# Patient Record
Sex: Female | Born: 1990 | Race: White | Hispanic: No | Marital: Married | Smoking: Never smoker
Health system: Southern US, Community
[De-identification: ages and names within clinical notes are randomized; demographics above are authoritative.]

## PROBLEM LIST (undated history)

## (undated) DIAGNOSIS — F419 Anxiety disorder, unspecified: Secondary | ICD-10-CM

## (undated) DIAGNOSIS — F329 Major depressive disorder, single episode, unspecified: Secondary | ICD-10-CM

## (undated) DIAGNOSIS — F32A Depression, unspecified: Secondary | ICD-10-CM

## (undated) DIAGNOSIS — G43909 Migraine, unspecified, not intractable, without status migrainosus: Secondary | ICD-10-CM

## (undated) DIAGNOSIS — B2790 Infectious mononucleosis, unspecified without complication: Secondary | ICD-10-CM

## (undated) DIAGNOSIS — L4050 Arthropathic psoriasis, unspecified: Secondary | ICD-10-CM

## (undated) HISTORY — DX: Depression, unspecified: F32.A

## (undated) HISTORY — DX: Major depressive disorder, single episode, unspecified: F32.9

## (undated) HISTORY — DX: Anxiety disorder, unspecified: F41.9

## (undated) HISTORY — DX: Arthropathic psoriasis, unspecified: L40.50

## (undated) HISTORY — DX: Infectious mononucleosis, unspecified without complication: B27.90

## (undated) HISTORY — PX: OTHER SURGICAL HISTORY: SHX169

## (undated) HISTORY — DX: Migraine, unspecified, not intractable, without status migrainosus: G43.909

---
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
## 2012-01-22 ENCOUNTER — Ambulatory Visit (INDEPENDENT_AMBULATORY_CARE_PROVIDER_SITE_OTHER): Payer: Managed Care, Other (non HMO) | Admitting: Family Medicine

## 2012-01-22 VITALS — BP 103/70 | HR 79 | Temp 97.7°F | Resp 16 | Ht 64.5 in | Wt 142.0 lb

## 2012-01-22 DIAGNOSIS — H9209 Otalgia, unspecified ear: Secondary | ICD-10-CM

## 2012-01-22 DIAGNOSIS — IMO0002 Reserved for concepts with insufficient information to code with codable children: Secondary | ICD-10-CM

## 2012-01-22 NOTE — Progress Notes (Signed)
  Patient Name: Ashley Calderon Date of Birth: [birth year] Medical Record Number: [id number] Gender: female Date of Encounter: 01/22/2012  History of Present Illness:  Ashley Calderon is a 20 y.o. very pleasant female patient who presents with the following:  Has noted a ?cyst behind her right ear for about a month- on the back side of the lobe.  It will sometimes be tender, but does not drain.  Sometims is red- however this may be due to her attempts to squeeze and "pop" it.  She sometiems notes an ache in the ear, but it is not persistent.  No hearing changes.  Otherwise generally healthy.   LMP 01/09/12 Here today with her mother- it seems she had a similar cyst as a child but it resolved on its own  Patient Active Problem List  Diagnoses  . WEIGHT LOSS  . ABDOMINAL PAIN, UNSPECIFIED SITE  . Panic disorder   Past Medical History  Diagnosis Date  . Mononucleosis April 2010   Past Surgical History  Procedure Date  . Ganglion cyst removed     left wrist    History  Substance Use Topics  . Smoking status: Never Smoker   . Smokeless tobacco: Not on file  . Alcohol Use: No   No family history on file. Allergies  Allergen Reactions  . Penicillins     REACTION: GI upset    Medication list has been reviewed and updated.  Review of Systems: As per HPI- otherwise negative.   Physical Examination: Filed Vitals:   01/22/12 0843  BP: 103/70  Pulse: 79  Temp: 97.7 F (36.5 C)  TempSrc: Oral  Resp: 16  Height: 5' 4.5" (1.638 m)  Weight: 142 lb (64.411 kg)    Body mass index is 24.00 kg/(m^2).  GEN: WDWN, NAD, Non-toxic, A & O x 3 HEENT: Atraumatic, Normocephalic. Neck supple. No masses, No LAD.  TM, internal ear canals wnl, oropharynx wnl.   On the back side of her right ear lobe there is a ?cystic structure under the skin.  It is not terribly well circumscribed or moveable, but is somewhat soft.  No redness, heat or swelling to suggest infection, it is mildly  tender Ears and Nose: No external deformity. CV: RRR, No M/G/R. No JVD. No thrill. No extra heart sounds. PULM: CTA B, no wheezes, crackles, rhonchi. No retractions. No resp. distress. No accessory muscle use EXTR: No c/c/e NEURO Normal gait.  PSYCH: Normally interactive. Conversant. Not depressed or anxious appearing.  Calm demeanor.   VC obtained.  Cleaned area with betadine and then alcohol.  Anesthesia with a small wheal of 2% lidocaine.  Pierced ?cyst with an 18 gauge needle, but it did not drain.    Assessment and Plan: 1. Cyst    Cyst on earlobe.  Given delicate location will not attempt open removal of cyst in the office and needle I and D failed.  Will refer to plastic surgery for their opinion and possible treatment.

## 2012-02-03 ENCOUNTER — Ambulatory Visit (INDEPENDENT_AMBULATORY_CARE_PROVIDER_SITE_OTHER): Payer: Managed Care, Other (non HMO) | Admitting: Family Medicine

## 2012-02-03 ENCOUNTER — Encounter: Payer: Self-pay | Admitting: Family Medicine

## 2012-02-03 VITALS — BP 100/68 | Temp 97.5°F | Wt 144.0 lb

## 2012-02-03 DIAGNOSIS — H6000 Abscess of external ear, unspecified ear: Secondary | ICD-10-CM

## 2012-02-03 DIAGNOSIS — L0202 Furuncle of face: Secondary | ICD-10-CM

## 2012-02-03 DIAGNOSIS — F410 Panic disorder [episodic paroxysmal anxiety] without agoraphobia: Secondary | ICD-10-CM

## 2012-02-03 MED ORDER — SERTRALINE HCL 50 MG PO TABS: 50.0000 mg | ORAL_TABLET | Freq: Every day | ORAL | Status: DC

## 2012-02-03 NOTE — Progress Notes (Signed)
  Subjective:    Patient ID: Ashley Calderon, female    DOB: Dec 15, 1990, 21 y.o.   MRN: [id number]  HPI  Patient seen for the following issues  History of panic attacks. Requesting refill sertraline. Medications work very well suppressing her panic attacks. She recently ran out for a brief period and had recurrent symptoms. She has not had any history of significant depression. No side effects.  Slightly painful cystic lesion behind right ear. Noted about a month ago. Went to a local urgent care. Attempt at I&D apparently with syringe with minimal for any drainage. She has only mild pain at this time. No fever or chills. No fluctuance.  Review of Systems  Constitutional: Negative for fever and chills.  Cardiovascular: Negative for chest pain.  Hematological: Negative for adenopathy.  Psychiatric/Behavioral: Negative for dysphoric mood. The patient is nervous/anxious.        Objective:   Physical Exam  Constitutional: She appears well-developed and well-nourished.  HENT:  Right Ear: External ear normal.  Left Ear: External ear normal.       Patient has small cystic lesion behind right ear. Nonfluctuant. Minimally tender. No warmth. Minimal erythema.  Neck: Neck supple. No thyromegaly present.  Cardiovascular: Normal rate and regular rhythm.   Pulmonary/Chest: Effort normal and breath sounds normal. No respiratory distress. She has no wheezes. She has no rales.  Lymphadenopathy:    She has no cervical adenopathy.          Assessment & Plan:  #1 recurrent panic attacks. Stable. Refill sertraline for one year #2 small furuncle posterior right ear. Warm compresses. Incision and drainage if any increased redness, swelling, or pain

## 2012-02-03 NOTE — Patient Instructions (Signed)
Warm compresses to cyst 2-3 times daily Follow up for any increased redness, swelling, or pain

## 2012-03-15 ENCOUNTER — Ambulatory Visit (INDEPENDENT_AMBULATORY_CARE_PROVIDER_SITE_OTHER): Payer: Managed Care, Other (non HMO) | Admitting: Internal Medicine

## 2012-03-15 ENCOUNTER — Encounter: Payer: Self-pay | Admitting: Internal Medicine

## 2012-03-15 VITALS — BP 120/80 | Temp 98.2°F | Wt 142.0 lb

## 2012-03-15 DIAGNOSIS — B999 Unspecified infectious disease: Secondary | ICD-10-CM

## 2012-03-15 DIAGNOSIS — L089 Local infection of the skin and subcutaneous tissue, unspecified: Secondary | ICD-10-CM

## 2012-03-15 MED ORDER — DOXYCYCLINE HYCLATE 100 MG PO TABS: 100.0000 mg | ORAL_TABLET | Freq: Two times a day (BID) | ORAL | Status: AC

## 2012-03-15 NOTE — Patient Instructions (Signed)
You  may move around, but avoid painful motions and activities.  Apply  Heat  to the sore area for 15 to 20 minutes 3 or 4 times daily for the next two to 3 days.  OK to use Advil or Tylenol  Take your antibiotic as prescribed until ALL of it is gone, but stop if you develop a rash, swelling, or any side effects of the medication.  Contact our office as soon as possible if  there are side effects of the medication.  Call or return to clinic prn if these symptoms worsen or fail to improve as anticipated.

## 2012-03-15 NOTE — Progress Notes (Signed)
  Subjective:    Patient ID: Ashley Calderon, female    DOB: 1990-12-15, 21 y.o.   MRN: [id number]  HPI  21 year old patient who presents with a 3 to four-day history of worsening left knee pain. There has been no trauma a couple days ago she had some low-grade fever that has resolved she has noted a area of pain and inflammation involving her right knee only.    Review of Systems  Musculoskeletal: Positive for joint swelling.  Skin: Positive for wound.       Objective:   Physical Exam  Skin:       A tender erythematous inflammatory nodule 2 cm in diameter just inferior to the patella          Assessment & Plan:   Soft tissue infection. Penicillin allergy. Will apply warm compresses and treated with doxycycline. She will call there is not prompt clinical improvement

## 2012-04-04 ENCOUNTER — Encounter: Payer: Self-pay | Admitting: Internal Medicine

## 2012-04-04 ENCOUNTER — Telehealth: Payer: Self-pay | Admitting: Family Medicine

## 2012-04-04 ENCOUNTER — Ambulatory Visit (INDEPENDENT_AMBULATORY_CARE_PROVIDER_SITE_OTHER): Payer: Managed Care, Other (non HMO) | Admitting: Internal Medicine

## 2012-04-04 VITALS — BP 112/74 | Temp 98.2°F | Wt 148.0 lb

## 2012-04-04 DIAGNOSIS — T50995A Adverse effect of other drugs, medicaments and biological substances, initial encounter: Secondary | ICD-10-CM

## 2012-04-04 DIAGNOSIS — T364X5A Adverse effect of tetracyclines, initial encounter: Secondary | ICD-10-CM

## 2012-04-04 LAB — BASIC METABOLIC PANEL
BUN: 9 mg/dL (ref 6–23)
CO2: 28 mEq/L (ref 19–32)
Calcium: 9.6 mg/dL (ref 8.4–10.5)
Chloride: 105 mEq/L (ref 96–112)
Glucose, Bld: 92 mg/dL (ref 70–99)
Potassium: 3.7 mEq/L (ref 3.5–5.1)
Sodium: 140 mEq/L (ref 135–145)

## 2012-04-04 LAB — CBC WITH DIFFERENTIAL/PLATELET
Basophils Absolute: 0 10*3/uL (ref 0.0–0.1)
Basophils Relative: 0.2 % (ref 0.0–3.0)
Eosinophils Absolute: 0.2 10*3/uL (ref 0.0–0.7)
Eosinophils Relative: 1.8 % (ref 0.0–5.0)
HCT: 38 % (ref 36.0–46.0)
Hemoglobin: 12.6 g/dL (ref 12.0–15.0)
Lymphocytes Relative: 29.8 % (ref 12.0–46.0)
Lymphs Abs: 2.5 10*3/uL (ref 0.7–4.0)
MCHC: 33.3 g/dL (ref 30.0–36.0)
MCV: 83.6 fl (ref 78.0–100.0)
Monocytes Absolute: 0.7 10*3/uL (ref 0.1–1.0)
Monocytes Relative: 8.2 % (ref 3.0–12.0)
Neutro Abs: 5 10*3/uL (ref 1.4–7.7)
RBC: 4.54 Mil/uL (ref 3.87–5.11)
RDW: 14.1 % (ref 11.5–14.6)
WBC: 8.3 10*3/uL (ref 4.5–10.5)

## 2012-04-04 LAB — HEPATIC FUNCTION PANEL
ALT: 19 U/L (ref 0–35)
Albumin: 4.4 g/dL (ref 3.5–5.2)
Alkaline Phosphatase: 73 U/L (ref 39–117)
Bilirubin, Direct: 0 mg/dL (ref 0.0–0.3)
Total Protein: 7.5 g/dL (ref 6.0–8.3)

## 2012-04-04 MED ORDER — PREDNISONE 10 MG PO TABS: ORAL_TABLET | ORAL | Status: DC

## 2012-04-04 NOTE — Progress Notes (Signed)
  Subjective:    Patient ID: Ashley Calderon, female    DOB: Jul 24, 1991, 21 y.o.   MRN: [id number]  HPI  21 year old white female complains of nail abnormality. Her symptoms started after she finished course of doxycycline for soft tissue infection in her knee. Patient reports all of her fingernails are affected. She has red discoloration underneath her nailbed and tips of her fingers are tender. Nail her great toes are also affected. She denies any joint pains or systemic rash. She has never taken doxycycline in the past.  Review of Systems No fever or joint pain  Past Medical History  Diagnosis Date  . Mononucleosis April 2010    History   Social History  . Marital Status: Single    Spouse Name: N/A    Number of Children: N/A  . Years of Education: N/A   Occupational History  . Not on file.   Social History Main Topics  . Smoking status: Never Smoker   . Smokeless tobacco: Not on file  . Alcohol Use: No  . Drug Use: No  . Sexually Active: No   Other Topics Concern  . Not on file   Social History Narrative   StudentLives with mom, dad, sister and brotherAttends Early College at [place].     Past Surgical History  Procedure Date  . Ganglion cyst removed     left wrist     No family history on file.  Allergies  Allergen Reactions  . Doxycycline Other (See Comments)    Possible vasculitis  . Penicillins     REACTION: GI upset    Current Outpatient Prescriptions on File Prior to Visit  Medication Sig Dispense Refill  . sertraline (ZOLOFT) 50 MG tablet Take 1 tablet (50 mg total) by mouth daily.  90 tablet  3    BP 112/74  Temp 98.2 F (36.8 C) (Oral)  Wt 148 lb (67.132 kg)       Objective:   Physical Exam  Constitutional: She appears well-developed and well-nourished.  HENT:  Head: Normocephalic and atraumatic.  Eyes: Conjunctivae are normal. Pupils are equal, round, and reactive to light.  Cardiovascular: Normal rate, regular rhythm and  normal heart sounds.   Pulmonary/Chest: Breath sounds normal. She has no wheezes.  Skin: No rash noted.       Patient has tender fingernails of hand bilaterally question subungual hemorrhage          Assessment & Plan:

## 2012-04-04 NOTE — Telephone Encounter (Signed)
Caller: Britini/Patient; PCP: Evelena Peat; CB#: [phone]; LMP 03/17/12.  Call regarding Finger Tenderness; Relates she has been on atbx for knee infection and has tenderness in fingernails and toenails and there are marks on them.  Onset "2 weeks ago" and getting worse; nails are changing colors.  Appt. with Dr. Artist Pais at 1400; caller states she is unable to make earlier appt. Hand Non-Injury protocol used.

## 2012-04-04 NOTE — Patient Instructions (Addendum)
Please call our office if your symptoms do not improve or gets worse.  

## 2012-04-04 NOTE — Assessment & Plan Note (Signed)
Patient complains of tender fingernails with possible subungual hemorrhage after taking doxycycline. Question vasculitis reaction. She has discontinued doxycycline. Treat with short course of prednisone. Check CBC, BMET, hepatic function.  Followup with primary care physician within one to 2 weeks.  Patient advised to call office if symptoms persist or worsen.

## 2012-04-05 ENCOUNTER — Telehealth: Payer: Self-pay | Admitting: Family Medicine

## 2012-04-05 NOTE — Telephone Encounter (Signed)
Ashley Calderon, FYI only.

## 2012-04-05 NOTE — Telephone Encounter (Signed)
Caller: Kao/Patient; PCP: Evelena Peat; CB#: [phone]; LMP 03/18/12. Calling today 04/05/12 regarding she saw Dr. Artist Pais yesterday for allergic reaction to doxycycline (nail beds were discolored) and MD prescribed Prednisone, said on first day she is to take 3 pills, wants to know if she takes all 3 pills at the same time.  Advised pt yes, take all 3 pills at once (reviewed medication order in EPIC with pt). Also now having joint pain that started last night. Wants to know if this could be related to the allergic reaction.  OFFICE, PLEASE CALL PT BACK AT [phone] TO ADVISE.

## 2012-04-05 NOTE — Telephone Encounter (Signed)
Ashley Calderon calling for results of the labs that she had yesterday, 7/31 when she was seen in the office. Results reviewed in EPIC, all wnl. Caller advised of same and has no further questions.

## 2012-04-05 NOTE — Telephone Encounter (Signed)
Patient informed that 'joint pain' can be another side effect of reaction to Doxycycline--per Dr. Artist Pais, continue prednisone and F/U with PCP in about [1] wk; pt has OV scheduled for 08.14.13, will reschedule for earlier if no better with prednisone and/or if symptoms worsen/SLS

## 2012-04-12 ENCOUNTER — Ambulatory Visit (INDEPENDENT_AMBULATORY_CARE_PROVIDER_SITE_OTHER): Payer: Managed Care, Other (non HMO) | Admitting: Family Medicine

## 2012-04-12 ENCOUNTER — Telehealth: Payer: Self-pay | Admitting: Family Medicine

## 2012-04-12 ENCOUNTER — Encounter: Payer: Self-pay | Admitting: Family Medicine

## 2012-04-12 VITALS — BP 100/60 | Temp 98.8°F | Wt 146.0 lb

## 2012-04-12 DIAGNOSIS — R202 Paresthesia of skin: Secondary | ICD-10-CM

## 2012-04-12 DIAGNOSIS — R209 Unspecified disturbances of skin sensation: Secondary | ICD-10-CM

## 2012-04-12 DIAGNOSIS — M2550 Pain in unspecified joint: Secondary | ICD-10-CM

## 2012-04-12 NOTE — Progress Notes (Signed)
Subjective:    Patient ID: Ashley Calderon, female    DOB: Feb 13, 1991, 21 y.o.   MRN: [id number]  HPI  Patient seen with polyarthralgias and intermittent paresthesias involving extremities. Her history is that she was seen June 11 and treated for cellulitis with doxycycline. Cellulitis did clear but she subsequently had some brownish discoloration underneath some of her nails and she was concerned this may be related to doxycycline. She describes around July 18 noticing fairly symmetric polyarthralgias involving neck, wrists, ankles, knees, elbows, and shoulders. No visible evidence for inflammation such as swelling or erythema and no warmth. Patient was seen here July 31 and lab work including CBC, hepatic panel, basic metabolic panel unremarkable. She was placed on prednisone which has not help much. Aleve helps somewhat. She continues to have polyarthralgias. She also describes intermittent fleeting episodes of paresthesias with tingling type sensation occasionally involving both hands and right lower extremity. No back pain. No rash. No fever. No lymphadenopathy. No appetite or weight changes. No recent sore throat. No extremity weakness.  Family history significant maternal grandmother rheumatoid arthritis. She's not had any recent tick bites. She did spend some time up in [place] earlier this summer.  Past Medical History  Diagnosis Date  . Mononucleosis April 2010   Past Surgical History  Procedure Date  . Ganglion cyst removed     left wrist     reports that she has never smoked. She does not have any smokeless tobacco history on file. She reports that she does not drink alcohol or use illicit drugs. family history is not on file. Allergies  Allergen Reactions  . Doxycycline Other (See Comments)    Possible vasculitis  . Penicillins     REACTION: GI upset      Review of Systems  Constitutional: Negative for fever and chills.  HENT: Negative for congestion and sore  throat.   Eyes: Negative for visual disturbance.  Respiratory: Negative for cough and shortness of breath.   Cardiovascular: Negative for chest pain.  Gastrointestinal: Negative for abdominal pain.  Genitourinary: Negative for dysuria and hematuria.  Musculoskeletal: Positive for arthralgias. Negative for myalgias, joint swelling and gait problem.  Skin: Negative for rash.  Neurological: Positive for numbness.  Hematological: Negative for adenopathy. Does not bruise/bleed easily.       Objective:   Physical Exam  Constitutional: She is oriented to person, place, and time. She appears well-developed and well-nourished.  HENT:  Mouth/Throat: Oropharynx is clear and moist.  Neck: Neck supple. No thyromegaly present.  Cardiovascular: Normal rate and regular rhythm.   No murmur heard. Pulmonary/Chest: Effort normal and breath sounds normal. No respiratory distress. She has no wheezes. She has no rales.  Musculoskeletal: She exhibits no edema.       Full range of motion of all joints. No evidence for any nodules. No evidence for joint inflammation such as warmth or erythema. No visible edema  Lymphadenopathy:    She has no cervical adenopathy.  Neurological: She is alert and oriented to person, place, and time. She has normal reflexes. No cranial nerve deficit.       Full-strength upper and lower extremities  Skin: No rash noted.          Assessment & Plan:  Patient presents with polyarthralgias for the past couple of weeks without evidence for objective joint inflammation. She also has some vague intermittent paresthesias involving multiple extremities. Recent labs as above unremarkable. Check sed rate, ANA, rheumatoid factor but suspected will  be normal. Followup promptly for any objective evidence for inflammatory changes

## 2012-04-12 NOTE — Telephone Encounter (Signed)
Caller: Laurie/Mother; PCP: Evelena Peat; CB#: [phone]; ; ; Call regarding numbness and tingling; Onset 03/19/12;  Mother reports that after the patient starting taking Doxycycline for cellulitis she developed joint pain and bruising to her nail beds. She was then given Prednisone to help with these symptoms and it has not helped. The patient has tingling and numbness in both hands and both feet. Afebrile. Emergent symptom of "Symptoms began after starting or changing dose of prescription, nonprescription, alternative medication, or illicit drug" positive per Numbness or Tingling guideline. Appointment scheduled with Dr. Caryl Never at 4:00pm 04/12/12.

## 2012-04-13 LAB — SEDIMENTATION RATE: Sed Rate: 13 mm/hr (ref 0–22)

## 2012-04-14 LAB — RHEUMATOID FACTOR: Rhuematoid fact SerPl-aCnc: <10 IU/mL (ref <=14)

## 2012-04-16 ENCOUNTER — Telehealth: Payer: Self-pay | Admitting: Family Medicine

## 2012-04-16 LAB — ANA: Anti Nuclear Antibody(ANA): NEGATIVE

## 2012-04-16 NOTE — Progress Notes (Signed)
Quick Note:  Pt informed ______ 

## 2012-04-16 NOTE — Telephone Encounter (Signed)
Pts mom called and said that she lft a message with triage nurse this morning re: pt running a fever 99.6 after taking 2 alleve. Pts mom said that she hasn't gotten a call back.

## 2012-04-16 NOTE — Progress Notes (Signed)
Quick Note:  Pt informed and the 99 temp came and went yesterday and today, resolved with Aleve. Pt was instructed to monitor temp and any other sx. Pt informed all labs came back in normal range, negative ______

## 2012-04-16 NOTE — Telephone Encounter (Signed)
Caller: Laurie/Mother; Patient Name: Ashley Calderon; PCP: Evelena Peat; Best Callback Phone Number: [phone].   Fever onset 04/14/12.  Temp low grade 99.2 to 99.6 oral. Joint pain and tingling persist since she was seen on 04/12/12.  Generalized joint pains.   Emergent symptoms ruled out.  Reviewed medical record.   See provider in 24 hours per nursing judgment and No Guideline protocol.  Caller declined appointment and states provider is waiting on further lab results.  She states she would, however send her  back if MD decides to do test for Lyme Disease.  Note to office for follow up per No Guideline or Reference Available prototcol and nursing judgment.

## 2012-04-16 NOTE — Telephone Encounter (Signed)
Pt informed so far all labs normal, ANA pending

## 2012-04-16 NOTE — Telephone Encounter (Signed)
Pt needs blood work results °

## 2012-04-16 NOTE — Telephone Encounter (Signed)
Dr Caryl Never informed verbally of call, he did not feel the fever would be related, however monitor for any other additional sx and follow-up as needed.

## 2012-04-18 ENCOUNTER — Ambulatory Visit: Payer: Managed Care, Other (non HMO) | Admitting: Family Medicine

## 2012-04-19 ENCOUNTER — Ambulatory Visit: Payer: Managed Care, Other (non HMO) | Admitting: Family Medicine

## 2012-04-19 ENCOUNTER — Telehealth: Payer: Self-pay | Admitting: Family Medicine

## 2012-04-19 DIAGNOSIS — M2550 Pain in unspecified joint: Secondary | ICD-10-CM

## 2012-04-19 NOTE — Telephone Encounter (Signed)
Pt has appt today with Dr. Burchette 

## 2012-04-19 NOTE — Telephone Encounter (Signed)
Pt called and is req to get lab order to test for lyme disease as previously discussed with  Dr Caryl Never. Pt still having symptoms. Wants test done today.

## 2012-04-20 ENCOUNTER — Other Ambulatory Visit (INDEPENDENT_AMBULATORY_CARE_PROVIDER_SITE_OTHER): Payer: Managed Care, Other (non HMO)

## 2012-04-20 ENCOUNTER — Telehealth: Payer: Self-pay | Admitting: *Deleted

## 2012-04-20 DIAGNOSIS — M2550 Pain in unspecified joint: Secondary | ICD-10-CM

## 2012-04-20 NOTE — Telephone Encounter (Signed)
Patient called yesterday and she cancelled the appt on yesterday due to cost and the MD previously told her that he would order the lyme disease test and that is all she need. Please assist.

## 2012-04-20 NOTE — Telephone Encounter (Signed)
Okay to order labs?

## 2012-04-20 NOTE — Telephone Encounter (Signed)
Yes  I ordered. Let them know to come in for lyme antibodies

## 2012-04-20 NOTE — Telephone Encounter (Signed)
Okay to order labs for lyme disease?

## 2012-04-20 NOTE — Telephone Encounter (Signed)
Left message on machine for patient to call back for a lab appointment.

## 2012-04-20 NOTE — Telephone Encounter (Signed)
I will order lyme antibody screen.

## 2012-04-20 NOTE — Addendum Note (Signed)
Addended by: Kristian Covey on: 04/20/2012 12:49 PM   Modules accepted: Orders

## 2012-04-23 ENCOUNTER — Telehealth: Payer: Self-pay | Admitting: Family Medicine

## 2012-04-23 DIAGNOSIS — R202 Paresthesia of skin: Secondary | ICD-10-CM

## 2012-04-23 DIAGNOSIS — M2550 Pain in unspecified joint: Secondary | ICD-10-CM

## 2012-04-23 LAB — B. BURGDORFI ANTIBODIES: B burgdorferi Ab IgG+IgM: 0.22 {ISR}

## 2012-04-23 NOTE — Telephone Encounter (Signed)
I am happy to refer to Dr Maurice March but I'm not sure he would be able to see in timely manner and she is describing multiple neurologic type findings and we should not wait long to get her in.  Lyme ab tests negative.  Let's offer her follow up here sooner if unable to get in to Dr Maurice March in next couple of days.

## 2012-04-23 NOTE — Telephone Encounter (Signed)
Patient's mom called stating that she would like a referral to see Dr. Lina [facility] for her daughter's persistent swelling and spot on her knee and bruising. Now shortness of breath and heaviness in chest. Patient had a low grade fever for 3 days and headaches everyday. Tested for Lyme Disease. Patient experienced vision changes and scrambled speech and trouble spelling and focusing all over the weekend. Please advise/assist.

## 2012-04-23 NOTE — Telephone Encounter (Signed)
Spoke with Mom and referral request sent.  Mom will call back if an office visit is needed.

## 2012-04-24 ENCOUNTER — Other Ambulatory Visit: Payer: Self-pay | Admitting: Family Medicine

## 2012-04-24 ENCOUNTER — Telehealth: Payer: Self-pay | Admitting: Family Medicine

## 2012-04-24 DIAGNOSIS — R202 Paresthesia of skin: Secondary | ICD-10-CM

## 2012-04-24 DIAGNOSIS — M2550 Pain in unspecified joint: Secondary | ICD-10-CM

## 2012-04-24 NOTE — Telephone Encounter (Signed)
Spoke with Mom.  Patient has an appointment tomorrow with Dr Caryl Never and referral request sent.

## 2012-04-24 NOTE — Telephone Encounter (Signed)
Caller: [name]/Patient; Patient Name: Ashley Calderon; PCP: Evelena Peat; Best Callback Phone Number: [phone].  Last menstrual cycle 04/20/12.  Patient calling today 04/24/12 regarding she was in to see MD 2 weeks ago was tested for R/A and Lime disease and tests came back negative.  Has been referred to Rheumatoligist but does not know when she can get in to see them.  Wants to be seen by Dr. Caryl Never today if possible.  Says symptoms have gotten worse since she saw MD.  She has had some diarrhea x2 today, persistent headache, persistent joint pain.  Also having shortness of breath that started yesterday.  Gets out of breath when she walks around.  Onset of other symptoms since the 03/05/12.  Afebrile.  Also fatigue.  Emergent symptoms ruled out by Breathing Problems guidelines with exception of new or worsening breathing problems that have not been evaluated.  Advised patient that Dr. Caryl Never does not have appointment available today, offered appointment with other provider.  Patient declined says only wants to see Dr. Caryl Never.  OFFICE PLEASE CALL PATIENT BACK AT [phone] TO LET HER KNOW IF SHE CAN BE WORKED IN TO SEE DR. Caryl Never (TRIAGE DISPOSITION WAS SEE IN ED IMMEDIATELY).  IF PATIENT NOT ABLE TO BE SEEN BY MD IN OFFICE TODAY, PLEASE ADVISE TO ED IF NEEDS TO BE SEEN.

## 2012-04-25 ENCOUNTER — Encounter: Payer: Self-pay | Admitting: Family Medicine

## 2012-04-25 ENCOUNTER — Ambulatory Visit (INDEPENDENT_AMBULATORY_CARE_PROVIDER_SITE_OTHER): Payer: Managed Care, Other (non HMO) | Admitting: Family Medicine

## 2012-04-25 ENCOUNTER — Ambulatory Visit (INDEPENDENT_AMBULATORY_CARE_PROVIDER_SITE_OTHER)
Admission: RE | Admit: 2012-04-25 | Discharge: 2012-04-25 | Disposition: A | Discharge status: Home / Self Care | Payer: Managed Care, Other (non HMO) | Source: Ambulatory Visit | Attending: Family Medicine | Admitting: Family Medicine

## 2012-04-25 VITALS — BP 100/62 | HR 87 | Temp 98.4°F | Wt 147.0 lb

## 2012-04-25 DIAGNOSIS — R0600 Dyspnea, unspecified: Secondary | ICD-10-CM

## 2012-04-25 DIAGNOSIS — R5381 Other malaise: Secondary | ICD-10-CM

## 2012-04-25 DIAGNOSIS — R5383 Other fatigue: Secondary | ICD-10-CM

## 2012-04-25 DIAGNOSIS — R0989 Other specified symptoms and signs involving the circulatory and respiratory systems: Secondary | ICD-10-CM

## 2012-04-25 DIAGNOSIS — R0609 Other forms of dyspnea: Secondary | ICD-10-CM

## 2012-04-25 DIAGNOSIS — M2550 Pain in unspecified joint: Secondary | ICD-10-CM

## 2012-04-25 NOTE — Progress Notes (Signed)
  Subjective:    Patient ID: Ashley Calderon, female    DOB: [birth year], 21 y.o.   MRN: [id number]  HPI  Patient seen with multitude of symptoms over the past several days. Refer to prior note for details. Was treated for possible cellulitis with doxycycline back in June and since that time has had multiple symptoms including polyarthralgias, paresthesias involving extremities, intermittent headache. Several phone calls last week. For about 3-4 days low-grade fever from the 10th through 13th of this month. Fever now resolved. Has intermittent sweats. No appetite or weight changes. His new symptom of some dyspnea with activity of the past several days. No dyspnea at rest. No cough. No orthopnea. No chest pain. No pleuritic pain.  Patient had several screening lab tests which have all been unremarkable. No signs of inflammatory arthritis. At the family's request, Lyme antibody sent which was negative. Patient is in process of being scheduled with rheumatologist.  Past Medical History  Diagnosis Date  . Mononucleosis April 2010   Past Surgical History  Procedure Date  . Ganglion cyst removed     left wrist     reports that she has never smoked. She does not have any smokeless tobacco history on file. She reports that she does not drink alcohol or use illicit drugs. family history is not on file. Allergies  Allergen Reactions  . Doxycycline Other (See Comments)    Possible vasculitis  . Penicillins     REACTION: GI upset      Review of Systems  Constitutional: Positive for fatigue. Negative for fever, chills, appetite change and unexpected weight change.  Respiratory: Positive for shortness of breath. Negative for cough and wheezing.   Cardiovascular: Negative for chest pain, palpitations and leg swelling.  Gastrointestinal: Negative for nausea, vomiting and abdominal pain.  Genitourinary: Negative for dysuria.  Musculoskeletal: Positive for arthralgias.  Skin: Negative for rash.   Neurological: Negative for dizziness and syncope.  Hematological: Negative for adenopathy. Does not bruise/bleed easily.       Objective:   Physical Exam  Constitutional: She appears well-developed and well-nourished.  HENT:  Right Ear: External ear normal.  Left Ear: External ear normal.  Mouth/Throat: Oropharynx is clear and moist.  Neck: Neck supple. No thyromegaly present.  Cardiovascular: Normal rate and regular rhythm.  Exam reveals no gallop and no friction rub.   No murmur heard. Pulmonary/Chest: Effort normal and breath sounds normal. No respiratory distress. She has no wheezes. She has no rales.  Musculoskeletal: She exhibits no edema.       No signs of joint inflammation  Lymphadenopathy:    She has no cervical adenopathy.          Assessment & Plan:  Patient presents with multiple symptoms including polyarthralgias, paresthesias extremities, intermittent sweats and now new symptom of dyspnea. Her exam is nonfocal and pulse oximetry 99%. Question reaction to recent viral illness. Check chest x-ray to rule out cardiomegaly. Patient has polyarthralgias but no objective evidence for inflammation and screening labs unremarkable. Rheumatology referral pending.

## 2012-09-08 IMAGING — CR DG CHEST 2V
2 series · 2 of 2 positions shown · non-contrast
Comparison: None.

CLINICAL DATA: Shortness of breath.

CHEST - 2 VIEW

[view not recorded (1 of 2)]
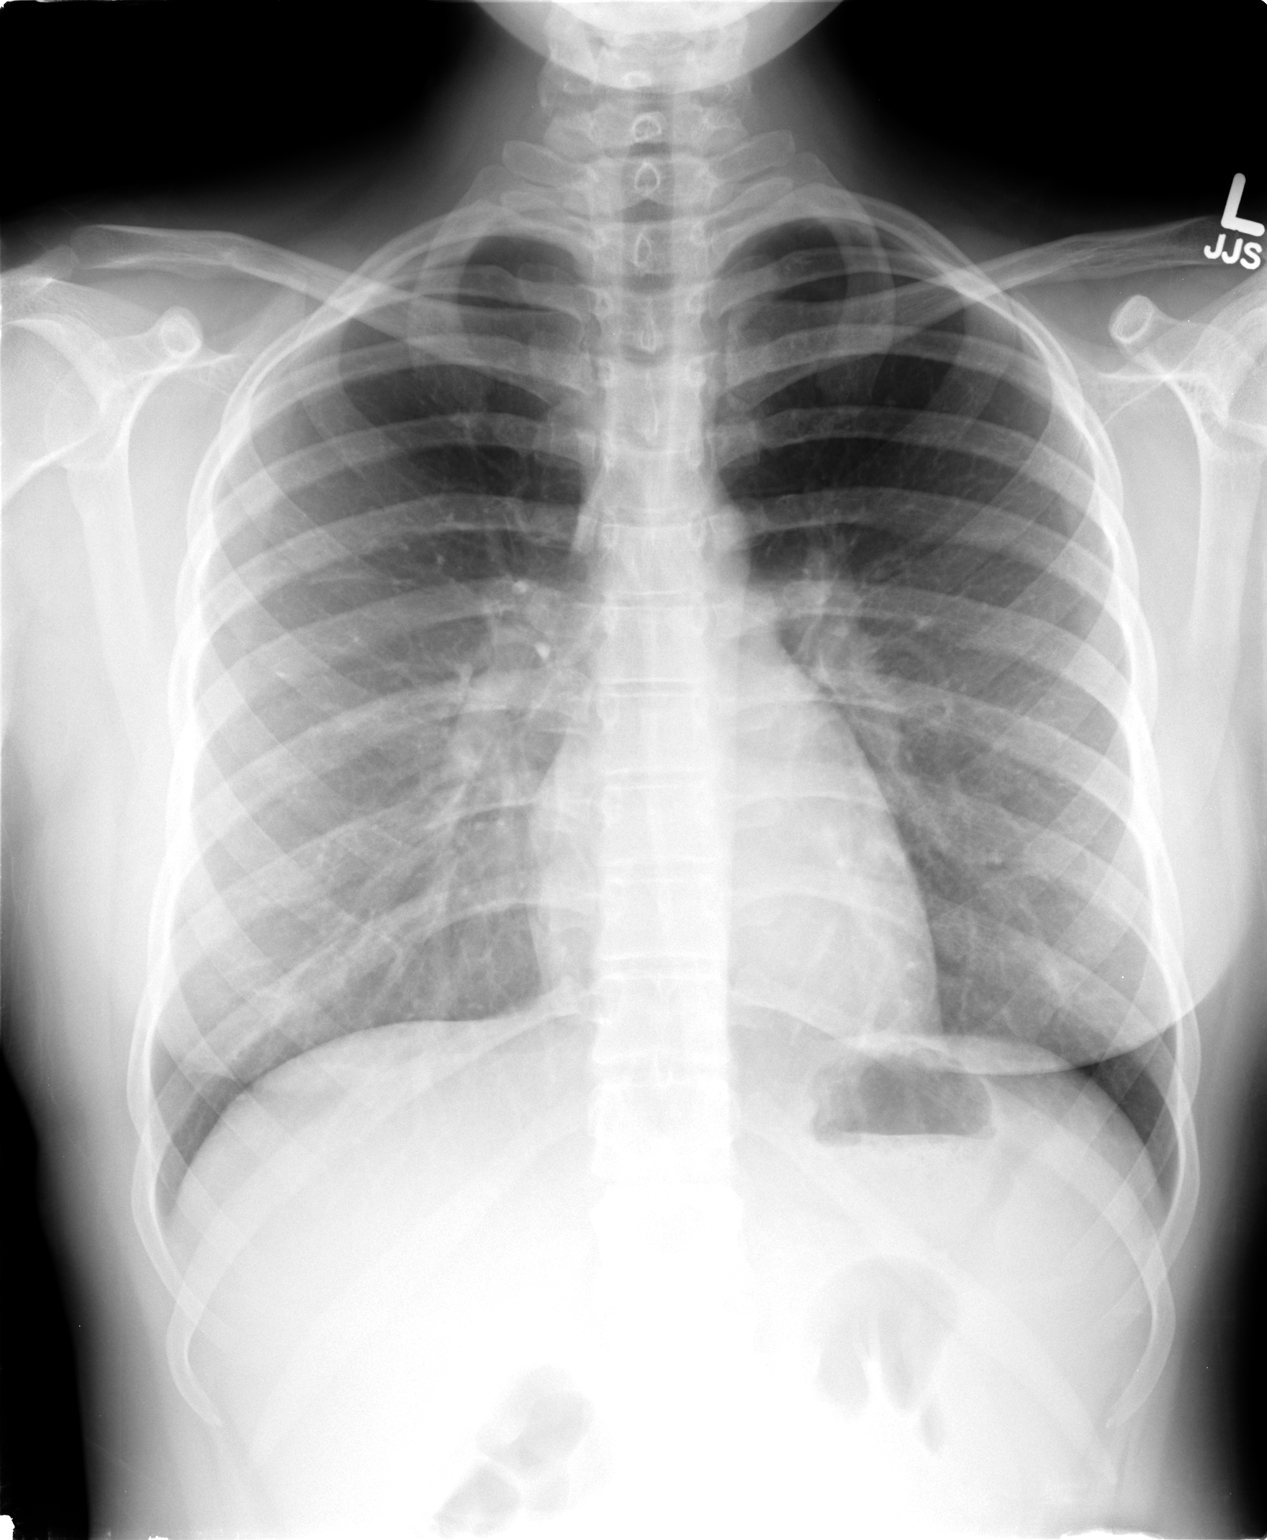

[view not recorded (2 of 2)]
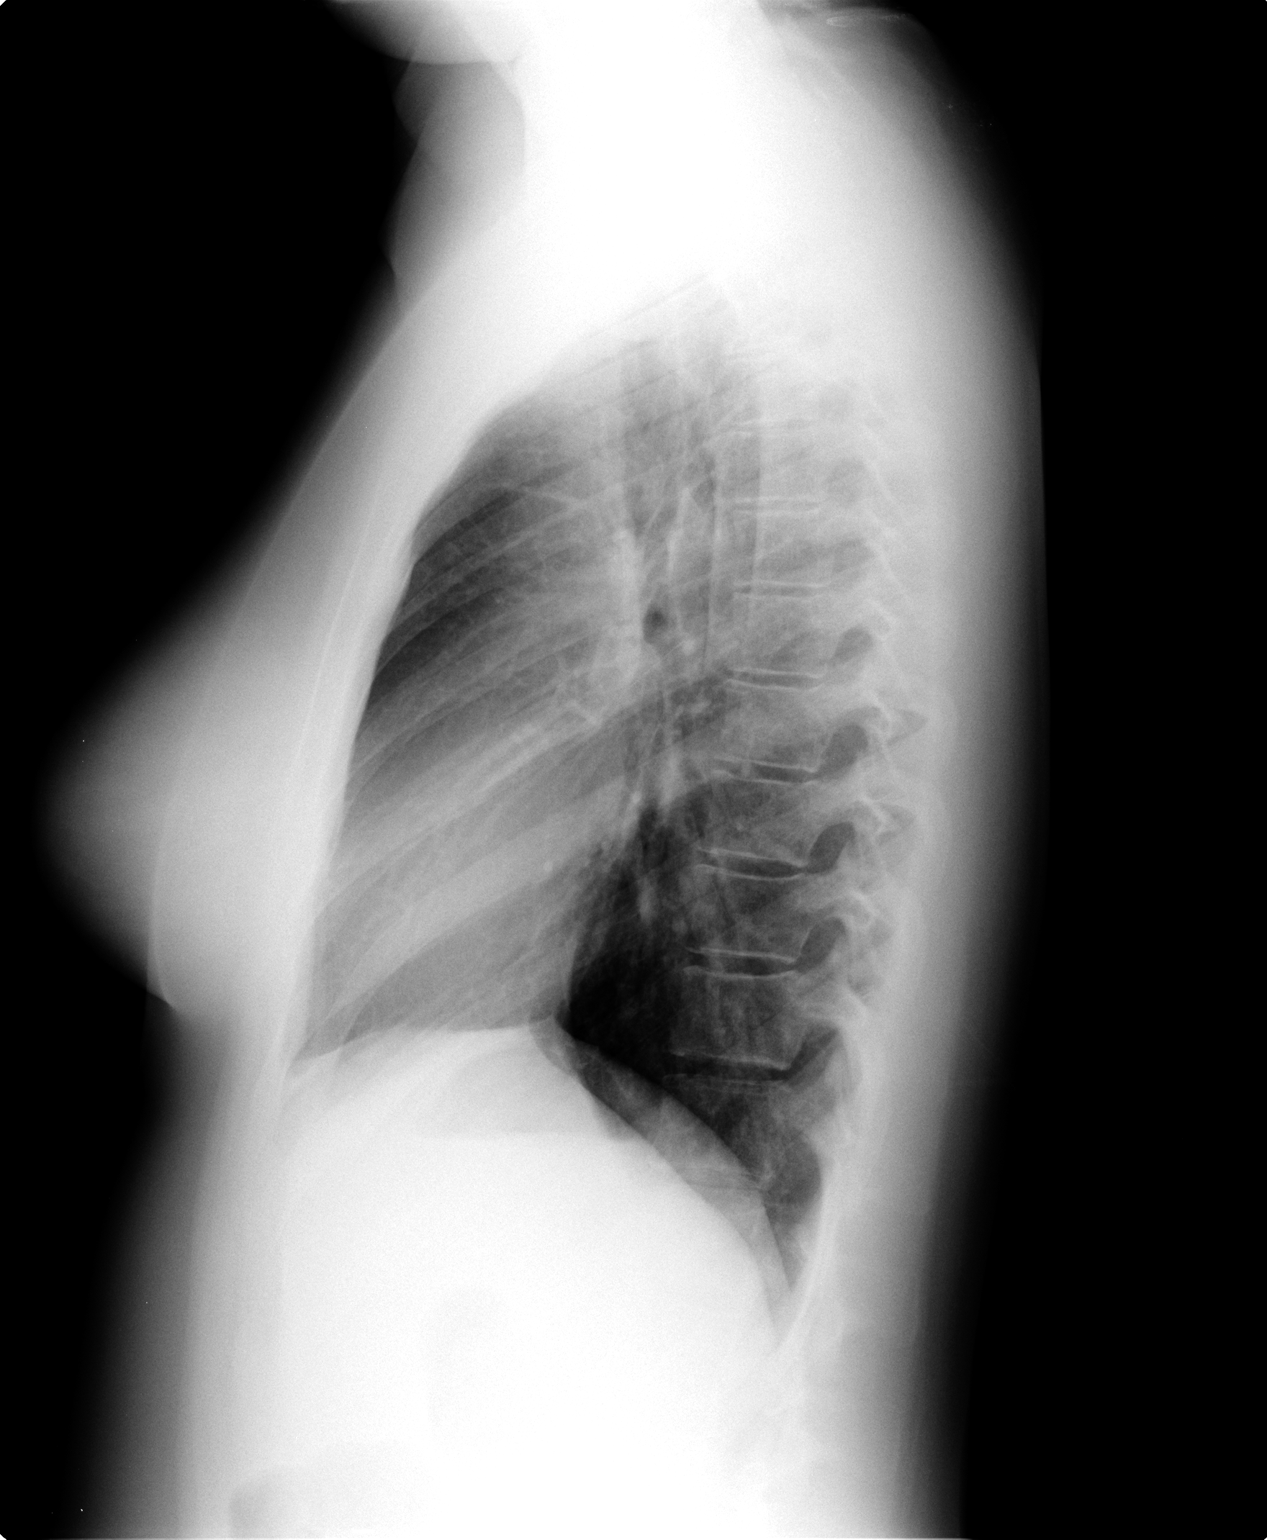

[2 of 2 positions shown; findings below may reference images not displayed]

FINDINGS: The cardiac silhouette is normal size and shape.  No
mediastinal or hilar abnormalities are evident.  No pulmonary
infiltrates or nodules were evident. No pleural abnormality is
evident. Bones appear average for age.
IMPRESSION: No acute or active cardiopulmonary or pleural abnormalities are
evident.

## 2012-10-10 ENCOUNTER — Encounter: Payer: Self-pay | Admitting: Family Medicine

## 2012-10-10 ENCOUNTER — Ambulatory Visit (INDEPENDENT_AMBULATORY_CARE_PROVIDER_SITE_OTHER): Payer: Managed Care, Other (non HMO) | Admitting: Family Medicine

## 2012-10-10 VITALS — BP 100/58 | HR 77 | Temp 98.4°F | Wt 153.0 lb

## 2012-10-10 DIAGNOSIS — G44009 Cluster headache syndrome, unspecified, not intractable: Secondary | ICD-10-CM

## 2012-10-10 MED ORDER — GABAPENTIN 300 MG PO CAPS: 300.0000 mg | ORAL_CAPSULE | Freq: Three times a day (TID) | ORAL | Status: DC

## 2012-10-10 MED ORDER — HYDROCODONE-ACETAMINOPHEN 5-325 MG PO TABS: 1.0000 | ORAL_TABLET | ORAL | Status: DC | PRN

## 2012-10-10 NOTE — Progress Notes (Signed)
  Subjective:    Patient ID: Ashley Calderon, female    DOB: December 31, 1990, 22 y.o.   MRN: [id number]  HPI Here for the sudden onset of headaches 3 weeks ago. Prior to that she may get a mild HA once or twice a week which would respond to Motrin. These HAs are much more severe and they do not respond to Motrin. They are always centered in the left temple area. They have a sharp knife-like quality but they are constant and not stabbing. They last 60-90 minutes each time, and she gets 3-4 of these a day. They make her nauseated but she does not vomit. No blurred vision, no other neurologic deficits at all. Bright lights and noises do not bother her at all when she has these. She feels better moving around than when lying still. Her LMP was 10-02-12. Her only medication is Zoloft, which she has been on for over a year. No BCP. No fevers or neck pain. Her brother has migraines.    Review of Systems  Constitutional: Negative.   Eyes: Negative.   Respiratory: Negative.   Cardiovascular: Negative.   Neurological: Positive for headaches. Negative for dizziness, tremors, seizures, syncope, facial asymmetry, speech difficulty, weakness, light-headedness and numbness.       Objective:   Physical Exam  Constitutional: She is oriented to person, place, and time. She appears well-developed and well-nourished. No distress.       No photophobia   HENT:  Head: Normocephalic and atraumatic.  Right Ear: External ear normal.  Left Ear: External ear normal.  Mouth/Throat: Oropharynx is clear and moist.  Eyes: Conjunctivae normal and EOM are normal. Pupils are equal, round, and reactive to light.  Neck: Normal range of motion. Neck supple. No thyromegaly present.  Cardiovascular: Normal rate, regular rhythm, normal heart sounds and intact distal pulses.   Pulmonary/Chest: Effort normal and breath sounds normal.  Lymphadenopathy:    She has no cervical adenopathy.  Neurological: She is alert and oriented to  person, place, and time. She has normal reflexes. No cranial nerve deficit. She exhibits normal muscle tone. Coordination normal.          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 Set up a head CT soon to rule out structural lesions. Try Vicodin for pain. Start on gabapentin tid. Follow up with Dr. Caryl Never in 2 weeks.

## 2012-10-12 ENCOUNTER — Telehealth: Payer: Self-pay | Admitting: Family Medicine

## 2012-10-12 NOTE — Telephone Encounter (Signed)
I left voice message with below information. 

## 2012-10-12 NOTE — Telephone Encounter (Signed)
Caller: Ashley Calderon/Patient; Phone: [phone]; Reason for Call: Patient calls after seeing Dr.  Clent Ridges on 10/10/12 regarding headaches that started 3 weeks ago.  She realizes that she had 3 fillings done on the left side of the mouth at the same time her headaches began.  She wants to know if he thinks this could be the cause of the headaches and if so does she still need the CT scan?

## 2012-10-12 NOTE — Addendum Note (Signed)
Addended by: Gershon Crane A on: 10/12/2012 08:51 AM   Modules accepted: Orders

## 2012-10-12 NOTE — Telephone Encounter (Signed)
Yes they may be related to the dental work. Actually her insurance company has refused to pay for a CT scan so I have referred her to our Neurologists instead. Joni Reining is working on this

## 2014-01-07 ENCOUNTER — Encounter: Payer: Self-pay | Admitting: Family Medicine

## 2014-01-07 ENCOUNTER — Ambulatory Visit (INDEPENDENT_AMBULATORY_CARE_PROVIDER_SITE_OTHER): Payer: BC Managed Care – PPO | Admitting: Family Medicine

## 2014-01-07 VITALS — BP 98/70 | HR 119 | Temp 98.7°F | Ht 64.5 in | Wt 150.5 lb

## 2014-01-07 DIAGNOSIS — H6980 Other specified disorders of Eustachian tube, unspecified ear: Secondary | ICD-10-CM

## 2014-01-07 DIAGNOSIS — H6990 Unspecified Eustachian tube disorder, unspecified ear: Secondary | ICD-10-CM

## 2014-01-07 DIAGNOSIS — H6590 Unspecified nonsuppurative otitis media, unspecified ear: Secondary | ICD-10-CM

## 2014-01-07 DIAGNOSIS — J329 Chronic sinusitis, unspecified: Secondary | ICD-10-CM

## 2014-01-07 DIAGNOSIS — J310 Chronic rhinitis: Secondary | ICD-10-CM

## 2014-01-07 MED ORDER — FLUTICASONE PROPIONATE 50 MCG/ACT NA SUSP: 2.0000 | Freq: Every day | NASAL | Status: DC

## 2014-01-07 NOTE — Patient Instructions (Addendum)
-  start AFRIN twice daily for 4 days - then STOP  -start flonase 2 sprays daily each nostril for 21 days  -claritin daily  -follow up if worsens or persists

## 2014-01-07 NOTE — Progress Notes (Signed)
No chief complaint on file.   HPI:  Acute visit for:  1) Earache: -started yesterday -has had nasal congestion, sneezing, PND for longer -denies:fevers, SOB, known strep exposure, tooth pain, sinus pain  ROS: See pertinent positives and negatives per HPI.  Past Medical History  Diagnosis Date  . Mononucleosis April 2010    Past Surgical History  Procedure Laterality Date  . Ganglion cyst removed      left wrist     No family history on file.  History   Social History  . Marital Status: Single    Spouse Name: N/A    Number of Children: N/A  . Years of Education: N/A   Social History Main Topics  . Smoking status: Never Smoker   . Smokeless tobacco: Never Used  . Alcohol Use: No  . Drug Use: No  . Sexual Activity: No   Other Topics Concern  . None   Social History Narrative   Student   Lives with mom, dad, sister and brother   [employer].     Current outpatient prescriptions:Norethindrone Acet-Ethinyl Est (LOESTRIN 1.5/30, 21, PO), Take by mouth., Disp: , Rfl: ;  fluticasone (FLONASE) 50 MCG/ACT nasal spray, Place 2 sprays into both nostrils daily., Disp: 16 g, Rfl: 1  EXAM:  Filed Vitals:   01/07/14 1501  BP: 98/70  Pulse: 119  Temp: 98.7 F (37.1 C)    Body mass index is 25.44 kg/(m^2).  GENERAL: vitals reviewed and listed above, alert, oriented, appears well hydrated and in no acute distress  HEENT: atraumatic, conjunttiva clear, no obvious abnormalities on inspection of external nose and ears, normal appearance of ear canals and TMs except for clear MEE, clear nasal congestion with boggy turbinates, mild post oropharyngeal erythema with PND, no tonsillar edema or exudate, no sinus TTP  NECK: no obvious masses on inspection  LUNGS: clear to auscultation bilaterally, no wheezes, rales or rhonchi, good air movement  CV: HRRR, no peripheral edema  MS: moves all extremities without noticeable abnormality  PSYCH:  pleasant and cooperative, no obvious depression or anxiety  ASSESSMENT AND PLAN:  Discussed the following assessment and plan:  Rhinosinusitis - Plan: fluticasone (FLONASE) 50 MCG/ACT nasal spray  MEE (middle ear effusion)  Eustachian tube dysfunction  -Patient advised to return or notify a doctor immediately if symptoms worsen or persist or new concerns arise.  Patient Instructions  -start AFRIN twice daily for 4 days - then STOP  -start flonase 2 sprays daily each nostril for 21 days  -claritin daily  -follow up if worsens or persists     Terressa KoyanagiHannah R. Jaquavian Firkus

## 2014-01-07 NOTE — Progress Notes (Signed)
Pre visit review using our clinic review tool, if applicable. No additional management support is needed unless otherwise documented below in the visit note. 

## 2014-05-09 ENCOUNTER — Encounter: Payer: Self-pay | Admitting: Family Medicine

## 2014-05-09 ENCOUNTER — Ambulatory Visit (INDEPENDENT_AMBULATORY_CARE_PROVIDER_SITE_OTHER): Payer: BC Managed Care – PPO | Admitting: Family Medicine

## 2014-05-09 VITALS — BP 110/80 | HR 110 | Temp 98.0°F | Wt 154.0 lb

## 2014-05-09 DIAGNOSIS — J018 Other acute sinusitis: Secondary | ICD-10-CM

## 2014-05-09 MED ORDER — AZITHROMYCIN 250 MG PO TABS: ORAL_TABLET | ORAL | Status: AC

## 2014-05-09 NOTE — Progress Notes (Signed)
   Subjective:    Patient ID: Ashley Calderon, female    DOB: December 24, 1990, 23 y.o.   MRN: [id number]  Sinusitis Associated symptoms include congestion and coughing. Pertinent negatives include no chills or sore throat.   Patient seen for acute visit.  She describes five-day history of some bilateral maxillary facial congestion and pressure. She's had dry cough. No headaches. No sore throat. Initially had greenish nasal discharge mostly clear past couple days. She has not tried any nasal steroids. She did start over-the-counter decongestant yesterday which may have helped slightly. Denies fever or chills.  Past Medical History  Diagnosis Date  . Mononucleosis April 2010   Past Surgical History  Procedure Laterality Date  . Ganglion cyst removed      left wrist     reports that she has never smoked. She has never used smokeless tobacco. She reports that she does not drink alcohol or use illicit drugs. family history is not on file. Allergies  Allergen Reactions  . Doxycycline Other (See Comments)    Possible vasculitis  . Penicillins     REACTION: GI upset      Review of Systems  Constitutional: Positive for fatigue. Negative for fever and chills.  HENT: Positive for congestion. Negative for sore throat.   Respiratory: Positive for cough.        Objective:   Physical Exam  Constitutional: She appears well-developed and well-nourished.  HENT:  Right Ear: External ear normal.  Left Ear: External ear normal.  Mouth/Throat: Oropharynx is clear and moist.  Neck: Neck supple.  Cardiovascular: Normal rate and regular rhythm.   Pulmonary/Chest: Effort normal and breath sounds normal. No respiratory distress. She has no wheezes. She has no rales.  Lymphadenopathy:    She has no cervical adenopathy.          Assessment & Plan:  Acute upper respiratory infection. We explained most sinusitis is viral. We recommend over-the-counter decongestant, consider nasal saline  irrigation and get back on Flonase. If not improving over the next few days add Zithromax. She is allergic to doxycycline and penicillin

## 2014-05-09 NOTE — Progress Notes (Signed)
Pre visit review using our clinic review tool, if applicable. No additional management support is needed unless otherwise documented below in the visit note. 

## 2014-11-04 ENCOUNTER — Telehealth: Payer: Self-pay | Admitting: Family Medicine

## 2014-11-04 NOTE — Telephone Encounter (Signed)
FYI

## 2014-11-04 NOTE — Telephone Encounter (Signed)
Patient Name: Ashley Calderon  DOB: [birth year]    Initial Comment Caller states she [facility], per previous call "Caller asking if the zika virus will affect future pregnancies"   Nurse Assessment  Nurse: Scarlette ArStandifer, RN, Heather Date/Time (Eastern Time): 11/04/2014 11:42:24 AM  Confirm and document reason for call. If symptomatic, describe symptoms. ---Caller requesting information about the zika virus, pregnancy and future pregnancies.  Has the patient traveled out of the country within the last 30 days? ---Not Applicable  Does the patient require triage? ---No  Please document clinical information provided and list any resource used. ---caller given information from website, http://harding.org/http://www.npr.org/sections/thetwo-way/2014/09/19/463254599/cdc-issues-travel-warning-after-explosion-of-dangerous-zika-virus. Caller verbalizes understanding and states that she will call back if needed.     Guidelines    Guideline Title Affirmed Question Affirmed Notes       Final Disposition User   Clinical Call Standifer, RN, [facility]

## 2015-02-16 ENCOUNTER — Encounter: Payer: Self-pay | Admitting: Family Medicine

## 2015-02-16 ENCOUNTER — Ambulatory Visit (INDEPENDENT_AMBULATORY_CARE_PROVIDER_SITE_OTHER): Payer: PRIVATE HEALTH INSURANCE | Admitting: Family Medicine

## 2015-02-16 VITALS — BP 110/80 | HR 64 | Temp 98.5°F | Wt 151.0 lb

## 2015-02-16 DIAGNOSIS — M25511 Pain in right shoulder: Secondary | ICD-10-CM

## 2015-02-16 NOTE — Progress Notes (Signed)
Pre visit review using our clinic review tool, if applicable. No additional management support is needed unless otherwise documented below in the visit note. 

## 2015-02-16 NOTE — Progress Notes (Signed)
   Subjective:    Patient ID: Ashley Calderon, female    DOB: October 16, 1990, 24 y.o.   MRN: [id number]  HPI Acute visit for right anterior shoulder pain. Onset about 2 days ago. No specific injury. She has done some recent weight lifting but denies specific injury. No neck pain. Took ibuprofen without relief. Location is anterior shoulder without radiation. Increased night pain. No weakness. No upper extremity numbness. No prior history of shoulder difficulties.  Past Medical History  Diagnosis Date  . Mononucleosis April 2010   Past Surgical History  Procedure Laterality Date  . Ganglion cyst removed      left wrist     reports that she has never smoked. She has never used smokeless tobacco. She reports that she does not drink alcohol or use illicit drugs. family history is not on file. Allergies  Allergen Reactions  . Doxycycline Other (See Comments)    Possible vasculitis  . Penicillins     REACTION: GI upset      Review of Systems  Cardiovascular: Negative for chest pain.  Musculoskeletal: Negative for neck pain.  Neurological: Negative for weakness and numbness.       Objective:   Physical Exam  Constitutional: She appears well-developed and well-nourished.  Cardiovascular: Normal rate and regular rhythm.   Pulmonary/Chest: Effort normal and breath sounds normal. No respiratory distress. She has no wheezes. She has no rales.  Musculoskeletal:  Cervical spine is nontender. Right shoulder no visible swelling or ecchymosis. No erythema. She has somewhat limited range of motion with abduction and internal rotation secondary to pain. No localized biceps tenderness. No acromioclavicular tenderness. Pain with abduction greater than about 50-60  Neurological:  Symmetric upper extremity reflexes. Normal sensory function. No obvious rotator cuff weakness          Assessment & Plan:  Right anterior shoulder pain. Etiology unclear. Duration only 2 days. Recommended icing  2-3 times daily, continue ibuprofen, gentle range of motion to avoid loss of range of motion. Touch 2 weeks if not improving. She will continue aerobic exercises but avoid overhead lifting

## 2015-02-16 NOTE — Patient Instructions (Signed)

## 2015-10-07 ENCOUNTER — Ambulatory Visit (INDEPENDENT_AMBULATORY_CARE_PROVIDER_SITE_OTHER): Payer: PRIVATE HEALTH INSURANCE | Admitting: Family Medicine

## 2015-10-07 ENCOUNTER — Encounter: Payer: Self-pay | Admitting: Family Medicine

## 2015-10-07 VITALS — BP 100/72 | HR 97 | Temp 98.4°F | Ht 64.5 in | Wt 159.0 lb

## 2015-10-07 DIAGNOSIS — F410 Panic disorder [episodic paroxysmal anxiety] without agoraphobia: Secondary | ICD-10-CM

## 2015-10-07 MED ORDER — FLUOXETINE HCL 20 MG PO TABS: 20.0000 mg | ORAL_TABLET | Freq: Every day | ORAL | Status: DC

## 2015-10-07 NOTE — Progress Notes (Signed)
Pre visit review using our clinic review tool, if applicable. No additional management support is needed unless otherwise documented below in the visit note. 

## 2015-10-07 NOTE — Progress Notes (Signed)
   Subjective:    Patient ID: Ashley Calderon, female    DOB: 1991-02-18, 25 y.o.   MRN: [id number]  HPI Patient is seen to discuss anxiety issues. She has a long history of anxiety and suspected panic disorder. She seemed to do well after high school for a while but recently has had much more frequent episodes. These come on unpredictably in many settings occasionally waking her out of sleep. She has multiple symptoms including noise sensitivity, occasional dyspnea, palpitations, nervousness. She previous attacks sertraline which worked fairly well but she states she had some side effects when she first started that. She would like to explore other options. She has noted that when she has anxiety episodes she usually feels much calmer while in the presence of their pets-dogs  Denies any depression symptoms. She is engaged to be married this June  Past Medical History  Diagnosis Date  . Mononucleosis April 2010   Past Surgical History  Procedure Laterality Date  . Ganglion cyst removed      left wrist     reports that she has never smoked. She has never used smokeless tobacco. She reports that she does not drink alcohol or use illicit drugs. family history is not on file. Allergies  Allergen Reactions  . Doxycycline Other (See Comments)    Possible vasculitis  . Penicillins     REACTION: GI upset      Review of Systems  Constitutional: Negative for appetite change and unexpected weight change.  Respiratory: Negative for cough.   Cardiovascular: Negative for chest pain.  Neurological: Negative for headaches.  Psychiatric/Behavioral: Negative for dysphoric mood and agitation. The patient is nervous/anxious.        Objective:   Physical Exam  Constitutional: She appears well-developed and well-nourished.  Neck: Neck supple. No thyromegaly present.  Cardiovascular: Normal rate and regular rhythm.   Pulmonary/Chest: Effort normal and breath sounds normal. No respiratory  distress. She has no wheezes. She has no rales.  Psychiatric: She has a normal mood and affect. Her behavior is normal. Judgment and thought content normal.          Assessment & Plan:  Anxiety. Strongly suspect panic disorder. Start fluoxetine 20 mg once daily. Discussed anxiety management. Touch base one month if not seeing improvement both in terms of frequency and severity of anxiety. Try to avoid benzodiazepines.

## 2015-10-19 ENCOUNTER — Institutional Professional Consult (permissible substitution): Payer: Managed Care, Other (non HMO) | Admitting: Medical

## 2015-10-19 ENCOUNTER — Telehealth: Payer: Self-pay

## 2015-10-19 NOTE — Telephone Encounter (Signed)
She was a new patient, so she don't get another chance!

## 2015-10-19 NOTE — Telephone Encounter (Signed)
This patient no showed for their appointment today.Which of the following is necessary for this patient.   A) No follow-up necessary   B) Follow-up urgent. Locate Patient Immediately.   C) Follow-up necessary. Contact patient and Schedule visit in ____ Days.   D) Follow-up Advised. Contact patient and Schedule visit in ____ Days. 

## 2016-10-17 ENCOUNTER — Ambulatory Visit (INDEPENDENT_AMBULATORY_CARE_PROVIDER_SITE_OTHER): Payer: BC Managed Care – PPO | Admitting: Family Medicine

## 2016-10-17 VITALS — BP 108/80 | HR 91 | Temp 98.0°F | Ht 64.5 in | Wt 171.0 lb

## 2016-10-17 DIAGNOSIS — R6883 Chills (without fever): Secondary | ICD-10-CM

## 2016-10-17 DIAGNOSIS — B349 Viral infection, unspecified: Secondary | ICD-10-CM | POA: Diagnosis not present

## 2016-10-17 LAB — POCT INFLUENZA A/B
Influenza A, POC: NEGATIVE
Influenza B, POC: NEGATIVE

## 2016-10-17 NOTE — Progress Notes (Signed)
Pre visit review using our clinic review tool, if applicable. No additional management support is needed unless otherwise documented below in the visit note. 

## 2016-10-17 NOTE — Patient Instructions (Signed)
Viral Illness, Adult Viruses are tiny germs that can get into a person's body and cause illness. There are many different types of viruses, and they cause many types of illness. Viral illnesses can range from mild to severe. They can affect various parts of the body. Common illnesses that are caused by a virus include colds and the flu. Viral illnesses also include serious conditions such as HIV/AIDS (human immunodeficiency virus/acquired immunodeficiency syndrome). A few viruses have been linked to certain cancers. What are the causes? Many types of viruses can cause illness. Viruses invade cells in your body, multiply, and cause the infected cells to malfunction or die. When the cell dies, it releases more of the virus. When this happens, you develop symptoms of the illness, and the virus continues to spread to other cells. If the virus takes over the function of the cell, it can cause the cell to divide and grow out of control, as is the case when a virus causes cancer. Different viruses get into the body in different ways. You can get a virus by:  Swallowing food or water that is contaminated with the virus.  Breathing in droplets that have been coughed or sneezed into the air by an infected person.  Touching a surface that has been contaminated with the virus and then touching your eyes, nose, or mouth.  Being bitten by an insect or animal that carries the virus.  Having sexual contact with a person who is infected with the virus.  Being exposed to blood or fluids that contain the virus, either through an open cut or during a transfusion. If a virus enters your body, your body's defense system (immune system) will try to fight the virus. You may be at higher risk for a viral illness if your immune system is weak. What are the signs or symptoms? Symptoms vary depending on the type of virus and the location of the cells that it invades. Common symptoms of the main types of viral illnesses  include: Cold and flu viruses   Fever.  Headache.  Sore throat.  Muscle aches.  Nasal congestion.  Cough. Digestive system (gastrointestinal) viruses   Fever.  Abdominal pain.  Nausea.  Diarrhea. Liver viruses (hepatitis)   Loss of appetite.  Tiredness.  Yellowing of the skin (jaundice). Brain and spinal cord viruses   Fever.  Headache.  Stiff neck.  Nausea and vomiting.  Confusion or sleepiness. Skin viruses   Warts.  Itching.  Rash. Sexually transmitted viruses   Discharge.  Swelling.  Redness.  Rash. How is this treated? Viruses can be difficult to treat because they live within cells. Antibiotic medicines do not treat viruses because these drugs do not get inside cells. Treatment for a viral illness may include:  Resting and drinking plenty of fluids.  Medicines to relieve symptoms. These can include over-the-counter medicine for pain and fever, medicines for cough or congestion, and medicines to relieve diarrhea.  Antiviral medicines. These drugs are available only for certain types of viruses. They may help reduce flu symptoms if taken early. There are also many antiviral medicines for hepatitis and HIV/AIDS. Some viral illnesses can be prevented with vaccinations. A common example is the flu shot. Follow these instructions at home: Medicines    Take over-the-counter and prescription medicines only as told by your health care provider.  If you were prescribed an antiviral medicine, take it as told by your health care provider. Do not stop taking the medicine even if you start to   feel better.  Be aware of when antibiotics are needed and when they are not needed. Antibiotics do not treat viruses. If your health care provider thinks that you may have a bacterial infection as well as a viral infection, you may get an antibiotic.  Do not ask for an antibiotic prescription if you have been diagnosed with a viral illness. That will not make  your illness go away faster.  Frequently taking antibiotics when they are not needed can lead to antibiotic resistance. When this develops, the medicine no longer works against the bacteria that it normally fights. General instructions   Drink enough fluids to keep your urine clear or pale yellow.  Rest as much as possible.  Return to your normal activities as told by your health care provider. Ask your health care provider what activities are safe for you.  Keep all follow-up visits as told by your health care provider. This is important. How is this prevented? Take these actions to reduce your risk of viral infection:  Eat a healthy diet and get enough rest.  Wash your hands often with soap and water. This is especially important when you are in public places. If soap and water are not available, use hand sanitizer.  Avoid close contact with friends and family who have a viral illness.  If you travel to areas where viral gastrointestinal infection is common, avoid drinking water or eating raw food.  Keep your immunizations up to date. Get a flu shot every year as told by your health care provider.  Do not share toothbrushes, nail clippers, razors, or needles with other people.  Always practice safe sex. Contact a health care provider if:  You have symptoms of a viral illness that do not go away.  Your symptoms come back after going away.  Your symptoms get worse. Get help right away if:  You have trouble breathing.  You have a severe headache or a stiff neck.  You have severe vomiting or abdominal pain. This information is not intended to replace advice given to you by your health care provider. Make sure you discuss any questions you have with your health care provider. Document Released: 01/01/2016 Document Revised: 02/03/2016 Document Reviewed: 01/01/2016 Elsevier Interactive Patient Education  2017 [employer]  

## 2016-10-17 NOTE — Progress Notes (Signed)
Subjective:     Patient ID: Ashley Calderon, female   DOB: August 14, 1991, 26 y.o.   MRN: [id number]  HPI   Patient seen with concern for possible influenza. Onset yesterday of headache, fatigue, chills, nasal congestion. No documented fever. No nausea or vomiting. She works around [employer] and is interested in ruling out influenza. Body aches relatively mild.  Past Medical History:  Diagnosis Date  . Mononucleosis April 2010   Past Surgical History:  Procedure Laterality Date  . ganglion cyst removed     left wrist     reports that she has never smoked. She has never used smokeless tobacco. She reports that she does not drink alcohol or use drugs. family history is not on file. Allergies  Allergen Reactions  . Doxycycline Other (See Comments)    Possible vasculitis  . Penicillins     REACTION: GI upset     Review of Systems  Constitutional: Positive for chills and fatigue. Negative for fever.  Respiratory: Positive for cough.   Musculoskeletal: Positive for myalgias.  Neurological: Positive for headaches.       Objective:   Physical Exam  Constitutional: She appears well-developed and well-nourished.  HENT:  Right Ear: External ear normal.  Left Ear: External ear normal.  Mouth/Throat: Oropharynx is clear and moist.  Neck: Neck supple.  Cardiovascular: Normal rate and regular rhythm.   Pulmonary/Chest: Effort normal and breath sounds normal. No respiratory distress. She has no wheezes. She has no rales.  Lymphadenopathy:    She has no cervical adenopathy.  Neurological: She is alert.       Assessment:     Viral syndrome. Doubt influenza    Plan:     -Patient requested influenza screen=negative -treat symptoms with OTC medications. -follow up as needed.  Kristian CoveyBruce W Antwuan Eckley MD [facility]

## 2016-12-13 LAB — OB RESULTS CONSOLE GBS: GBS: POSITIVE

## 2016-12-13 LAB — OB RESULTS CONSOLE GC/CHLAMYDIA
Chlamydia: NEGATIVE
GC PROBE AMP, GENITAL: NEGATIVE

## 2016-12-13 LAB — OB RESULTS CONSOLE RPR: RPR: NONREACTIVE

## 2016-12-13 LAB — OB RESULTS CONSOLE RUBELLA ANTIBODY, IGM: RUBELLA: IMMUNE

## 2016-12-13 LAB — OB RESULTS CONSOLE ABO/RH: RH Type: POSITIVE

## 2016-12-13 LAB — OB RESULTS CONSOLE HIV ANTIBODY (ROUTINE TESTING): HIV: NONREACTIVE

## 2016-12-13 LAB — OB RESULTS CONSOLE HEPATITIS B SURFACE ANTIGEN: Hepatitis B Surface Ag: NEGATIVE

## 2016-12-13 LAB — OB RESULTS CONSOLE ANTIBODY SCREEN: Antibody Screen: NEGATIVE

## 2017-07-07 ENCOUNTER — Encounter (HOSPITAL_COMMUNITY): Payer: Self-pay | Admitting: *Deleted

## 2017-07-07 ENCOUNTER — Telehealth (HOSPITAL_COMMUNITY): Payer: Self-pay | Admitting: *Deleted

## 2017-07-07 NOTE — Telephone Encounter (Signed)
Preadmission screen  

## 2017-07-18 ENCOUNTER — Inpatient Hospital Stay (HOSPITAL_COMMUNITY)
Admission: AD | Admit: 2017-07-18 | Discharge: 2017-07-20 | DRG: 788 | Disposition: A | Discharge status: Home / Self Care | Payer: BC Managed Care – PPO | Source: Ambulatory Visit | Attending: Obstetrics and Gynecology | Admitting: Obstetrics and Gynecology

## 2017-07-18 ENCOUNTER — Inpatient Hospital Stay (HOSPITAL_COMMUNITY)
Admission: RE | Admit: 2017-07-18 | Discharge: 2017-07-18 | Disposition: A | Discharge status: Home / Self Care | Payer: BC Managed Care – PPO | Source: Ambulatory Visit | Attending: Obstetrics and Gynecology | Admitting: Obstetrics and Gynecology

## 2017-07-18 ENCOUNTER — Encounter (HOSPITAL_COMMUNITY): Payer: Self-pay | Admitting: Certified Registered Nurse Anesthetist

## 2017-07-18 ENCOUNTER — Encounter (HOSPITAL_COMMUNITY): Payer: Self-pay

## 2017-07-18 ENCOUNTER — Inpatient Hospital Stay (HOSPITAL_COMMUNITY): Payer: BC Managed Care – PPO | Admitting: Anesthesiology

## 2017-07-18 ENCOUNTER — Encounter (HOSPITAL_COMMUNITY)
Admission: AD | Disposition: A | Discharge status: Home / Self Care | Payer: Self-pay | Source: Ambulatory Visit | Attending: Obstetrics and Gynecology

## 2017-07-18 DIAGNOSIS — O99824 Streptococcus B carrier state complicating childbirth: Secondary | ICD-10-CM | POA: Diagnosis present

## 2017-07-18 DIAGNOSIS — Z3A39 39 weeks gestation of pregnancy: Secondary | ICD-10-CM | POA: Diagnosis not present

## 2017-07-18 DIAGNOSIS — Z98891 History of uterine scar from previous surgery: Secondary | ICD-10-CM

## 2017-07-18 DIAGNOSIS — Z3490 Encounter for supervision of normal pregnancy, unspecified, unspecified trimester: Secondary | ICD-10-CM

## 2017-07-18 DIAGNOSIS — Z3483 Encounter for supervision of other normal pregnancy, third trimester: Secondary | ICD-10-CM | POA: Diagnosis present

## 2017-07-18 LAB — CBC
HCT: 32.7 % — ABNORMAL LOW (ref 36.0–46.0)
HEMOGLOBIN: 11.5 g/dL — AB (ref 12.0–15.0)
MCH: 27.8 pg (ref 26.0–34.0)
MCHC: 35.2 g/dL (ref 30.0–36.0)
MCV: 79.2 fL (ref 78.0–100.0)
PLATELETS: 212 10*3/uL (ref 150–400)
RBC: 4.13 MIL/uL (ref 3.87–5.11)
RDW: 13.7 % (ref 11.5–15.5)
WBC: 12.5 10*3/uL — ABNORMAL HIGH (ref 4.0–10.5)

## 2017-07-18 LAB — TYPE AND SCREEN
ABO/RH(D): B POS
Antibody Screen: NEGATIVE

## 2017-07-18 LAB — ABO/RH: ABO/RH(D): B POS

## 2017-07-18 SURGERY — Surgical Case
Anesthesia: Epidural | Wound class: Clean Contaminated

## 2017-07-18 MED ORDER — CEFAZOLIN SODIUM-DEXTROSE 2-3 GM-%(50ML) IV SOLR
INTRAVENOUS | Status: DC | PRN
  Administered 2017-07-18: 2 g via INTRAVENOUS

## 2017-07-18 MED ORDER — LACTATED RINGERS IV SOLN
INTRAVENOUS | Status: DC
  Administered 2017-07-18: 14:00:00 via INTRAVENOUS
  Administered 2017-07-18: 125 mL via INTRAVENOUS

## 2017-07-18 MED ORDER — CLINDAMYCIN PHOSPHATE 900 MG/50ML IV SOLN: 900.0000 mg | Freq: Once | INTRAVENOUS | Status: DC

## 2017-07-18 MED ORDER — MORPHINE SULFATE (PF) 0.5 MG/ML IJ SOLN
INTRAMUSCULAR | Status: AC
  Filled 2017-07-18: qty 10

## 2017-07-18 MED ORDER — SIMETHICONE 80 MG PO CHEW
80.0000 mg | CHEWABLE_TABLET | ORAL | Status: DC | PRN
Start: 2017-07-18 — End: 2017-07-20

## 2017-07-18 MED ORDER — SIMETHICONE 80 MG PO CHEW
80.0000 mg | CHEWABLE_TABLET | ORAL | Status: DC
  Administered 2017-07-18: 80 mg via ORAL
  Filled 2017-07-18 (×2): qty 1

## 2017-07-18 MED ORDER — SCOPOLAMINE 1 MG/3DAYS TD PT72
MEDICATED_PATCH | TRANSDERMAL | Status: AC
  Filled 2017-07-18: qty 1

## 2017-07-18 MED ORDER — TETANUS-DIPHTH-ACELL PERTUSSIS 5-2.5-18.5 LF-MCG/0.5 IM SUSP: 0.5000 mL | Freq: Once | INTRAMUSCULAR | Status: DC

## 2017-07-18 MED ORDER — ONDANSETRON HCL 4 MG/2ML IJ SOLN
INTRAMUSCULAR | Status: AC
  Filled 2017-07-18: qty 2

## 2017-07-18 MED ORDER — PROMETHAZINE HCL 25 MG/ML IJ SOLN: 6.2500 mg | INTRAMUSCULAR | Status: DC | PRN

## 2017-07-18 MED ORDER — SODIUM BICARBONATE 8.4 % IV SOLN
INTRAVENOUS | Status: DC | PRN
  Administered 2017-07-18: 5 mL via EPIDURAL
  Administered 2017-07-18: 3 mL via EPIDURAL

## 2017-07-18 MED ORDER — PHENYLEPHRINE 40 MCG/ML (10ML) SYRINGE FOR IV PUSH (FOR BLOOD PRESSURE SUPPORT)
80.0000 ug | PREFILLED_SYRINGE | INTRAVENOUS | Status: DC | PRN
  Filled 2017-07-18 (×2): qty 10

## 2017-07-18 MED ORDER — PHENYLEPHRINE 40 MCG/ML (10ML) SYRINGE FOR IV PUSH (FOR BLOOD PRESSURE SUPPORT)
80.0000 ug | PREFILLED_SYRINGE | INTRAVENOUS | Status: AC | PRN
  Administered 2017-07-18 (×3): 80 ug via INTRAVENOUS

## 2017-07-18 MED ORDER — OXYTOCIN 40 UNITS IN LACTATED RINGERS INFUSION - SIMPLE MED
1.0000 m[IU]/min | INTRAVENOUS | Status: DC
  Administered 2017-07-18: 2 m[IU]/min via INTRAVENOUS
  Filled 2017-07-18: qty 1000

## 2017-07-18 MED ORDER — DIPHENHYDRAMINE HCL 50 MG/ML IJ SOLN: 12.5000 mg | INTRAMUSCULAR | Status: DC | PRN

## 2017-07-18 MED ORDER — TERBUTALINE SULFATE 1 MG/ML IJ SOLN
0.2500 mg | Freq: Once | INTRAMUSCULAR | Status: AC
Start: 2017-07-18 — End: 2017-07-18
  Administered 2017-07-18: 0.25 mg via SUBCUTANEOUS

## 2017-07-18 MED ORDER — FENTANYL CITRATE (PF) 100 MCG/2ML IJ SOLN
INTRAMUSCULAR | Status: DC | PRN
  Administered 2017-07-18: 100 ug via INTRAVENOUS

## 2017-07-18 MED ORDER — WITCH HAZEL-GLYCERIN EX PADS: 1.0000 "application " | MEDICATED_PAD | CUTANEOUS | Status: DC | PRN

## 2017-07-18 MED ORDER — IBUPROFEN 600 MG PO TABS
600.0000 mg | ORAL_TABLET | Freq: Four times a day (QID) | ORAL | Status: DC
Start: 2017-07-18 — End: 2017-07-20
  Administered 2017-07-18 – 2017-07-20 (×7): 600 mg via ORAL
  Filled 2017-07-18 (×7): qty 1

## 2017-07-18 MED ORDER — SIMETHICONE 80 MG PO CHEW
80.0000 mg | CHEWABLE_TABLET | Freq: Three times a day (TID) | ORAL | Status: DC
  Administered 2017-07-19 – 2017-07-20 (×6): 80 mg via ORAL
  Filled 2017-07-18 (×5): qty 1

## 2017-07-18 MED ORDER — ACETAMINOPHEN 325 MG PO TABS: 650.0000 mg | ORAL_TABLET | ORAL | Status: DC | PRN

## 2017-07-18 MED ORDER — CLINDAMYCIN PHOSPHATE 900 MG/50ML IV SOLN
900.0000 mg | Freq: Three times a day (TID) | INTRAVENOUS | Status: DC
  Administered 2017-07-18: 900 mg via INTRAVENOUS
  Filled 2017-07-18 (×2): qty 50

## 2017-07-18 MED ORDER — MORPHINE SULFATE (PF) 0.5 MG/ML IJ SOLN
INTRAMUSCULAR | Status: DC | PRN
  Administered 2017-07-18: 3 mg via EPIDURAL

## 2017-07-18 MED ORDER — OXYCODONE HCL 5 MG PO TABS: 5.0000 mg | ORAL_TABLET | Freq: Once | ORAL | Status: DC | PRN

## 2017-07-18 MED ORDER — CEFAZOLIN SODIUM-DEXTROSE 2-3 GM-%(50ML) IV SOLR
INTRAVENOUS | Status: AC
  Filled 2017-07-18: qty 50

## 2017-07-18 MED ORDER — DEXAMETHASONE SODIUM PHOSPHATE 10 MG/ML IJ SOLN
INTRAMUSCULAR | Status: DC | PRN
  Administered 2017-07-18: 10 mg via INTRAVENOUS

## 2017-07-18 MED ORDER — MEPERIDINE HCL 25 MG/ML IJ SOLN: 6.2500 mg | INTRAMUSCULAR | Status: DC | PRN

## 2017-07-18 MED ORDER — ONDANSETRON HCL 4 MG/2ML IJ SOLN: 4.0000 mg | Freq: Four times a day (QID) | INTRAMUSCULAR | Status: DC | PRN

## 2017-07-18 MED ORDER — EPHEDRINE 5 MG/ML INJ: 10.0000 mg | INTRAVENOUS | Status: DC | PRN

## 2017-07-18 MED ORDER — TERBUTALINE SULFATE 1 MG/ML IJ SOLN
0.2500 mg | Freq: Once | INTRAMUSCULAR | Status: DC | PRN
  Filled 2017-07-18: qty 1

## 2017-07-18 MED ORDER — OXYCODONE-ACETAMINOPHEN 5-325 MG PO TABS: 2.0000 | ORAL_TABLET | ORAL | Status: DC | PRN

## 2017-07-18 MED ORDER — LACTATED RINGERS IV SOLN: INTRAVENOUS | Status: DC

## 2017-07-18 MED ORDER — OXYTOCIN BOLUS FROM INFUSION: 500.0000 mL | Freq: Once | INTRAVENOUS | Status: DC

## 2017-07-18 MED ORDER — PHENYLEPHRINE HCL 10 MG/ML IJ SOLN
INTRAMUSCULAR | Status: DC | PRN
  Administered 2017-07-18 (×2): 80 ug via INTRAVENOUS

## 2017-07-18 MED ORDER — LACTATED RINGERS IV SOLN
INTRAVENOUS | Status: DC | PRN
  Administered 2017-07-18: 16:00:00 via INTRAVENOUS

## 2017-07-18 MED ORDER — COCONUT OIL OIL: 1.0000 "application " | TOPICAL_OIL | Status: DC | PRN

## 2017-07-18 MED ORDER — OXYTOCIN 10 UNIT/ML IJ SOLN
INTRAMUSCULAR | Status: AC
  Filled 2017-07-18: qty 4

## 2017-07-18 MED ORDER — LIDOCAINE HCL (PF) 1 % IJ SOLN
INTRAMUSCULAR | Status: DC | PRN
  Administered 2017-07-18: 13 mL via EPIDURAL

## 2017-07-18 MED ORDER — DIPHENHYDRAMINE HCL 25 MG PO CAPS: 25.0000 mg | ORAL_CAPSULE | Freq: Four times a day (QID) | ORAL | Status: DC | PRN

## 2017-07-18 MED ORDER — OXYTOCIN 40 UNITS IN LACTATED RINGERS INFUSION - SIMPLE MED: 2.5000 [IU]/h | INTRAVENOUS | Status: AC

## 2017-07-18 MED ORDER — HYDROMORPHONE HCL 1 MG/ML IJ SOLN: 0.2500 mg | INTRAMUSCULAR | Status: DC | PRN

## 2017-07-18 MED ORDER — OXYCODONE HCL 5 MG/5ML PO SOLN: 5.0000 mg | Freq: Once | ORAL | Status: DC | PRN

## 2017-07-18 MED ORDER — OXYCODONE-ACETAMINOPHEN 5-325 MG PO TABS
1.0000 | ORAL_TABLET | ORAL | Status: DC | PRN
  Administered 2017-07-19 – 2017-07-20 (×3): 1 via ORAL
  Filled 2017-07-18 (×3): qty 1

## 2017-07-18 MED ORDER — ZOLPIDEM TARTRATE 5 MG PO TABS: 5.0000 mg | ORAL_TABLET | Freq: Every evening | ORAL | Status: DC | PRN

## 2017-07-18 MED ORDER — LACTATED RINGERS IV SOLN
500.0000 mL | INTRAVENOUS | Status: DC | PRN
  Administered 2017-07-18: 200 mL via INTRAVENOUS

## 2017-07-18 MED ORDER — MENTHOL 3 MG MT LOZG: 1.0000 | LOZENGE | OROMUCOSAL | Status: DC | PRN

## 2017-07-18 MED ORDER — ONDANSETRON HCL 4 MG/2ML IJ SOLN
INTRAMUSCULAR | Status: DC | PRN
  Administered 2017-07-18: 4 mg via INTRAVENOUS

## 2017-07-18 MED ORDER — MIDAZOLAM HCL 2 MG/2ML IJ SOLN
INTRAMUSCULAR | Status: AC
  Filled 2017-07-18: qty 2

## 2017-07-18 MED ORDER — FENTANYL CITRATE (PF) 100 MCG/2ML IJ SOLN
INTRAMUSCULAR | Status: AC
  Filled 2017-07-18: qty 2

## 2017-07-18 MED ORDER — DIBUCAINE 1 % RE OINT: 1.0000 "application " | TOPICAL_OINTMENT | RECTAL | Status: DC | PRN

## 2017-07-18 MED ORDER — FENTANYL 2.5 MCG/ML BUPIVACAINE 1/10 % EPIDURAL INFUSION (WH - ANES)
14.0000 mL/h | INTRAMUSCULAR | Status: DC | PRN
  Administered 2017-07-18: 14 mL/h via EPIDURAL
  Filled 2017-07-18: qty 100

## 2017-07-18 MED ORDER — PRENATAL MULTIVITAMIN CH
1.0000 | ORAL_TABLET | Freq: Every day | ORAL | Status: DC
  Administered 2017-07-19 – 2017-07-20 (×2): 1 via ORAL
  Filled 2017-07-18 (×2): qty 1

## 2017-07-18 MED ORDER — SENNOSIDES-DOCUSATE SODIUM 8.6-50 MG PO TABS
2.0000 | ORAL_TABLET | ORAL | Status: DC
  Administered 2017-07-18: 2 via ORAL
  Filled 2017-07-18: qty 2

## 2017-07-18 MED ORDER — OXYTOCIN 40 UNITS IN LACTATED RINGERS INFUSION - SIMPLE MED: 2.5000 [IU]/h | INTRAVENOUS | Status: DC

## 2017-07-18 MED ORDER — OXYCODONE-ACETAMINOPHEN 5-325 MG PO TABS: 1.0000 | ORAL_TABLET | ORAL | Status: DC | PRN

## 2017-07-18 MED ORDER — LIDOCAINE HCL (PF) 1 % IJ SOLN: 30.0000 mL | INTRAMUSCULAR | Status: DC | PRN

## 2017-07-18 MED ORDER — OXYTOCIN 10 UNIT/ML IJ SOLN
INTRAVENOUS | Status: DC | PRN
  Administered 2017-07-18: 40 [IU] via INTRAVENOUS

## 2017-07-18 MED ORDER — MIDAZOLAM HCL 2 MG/2ML IJ SOLN
INTRAMUSCULAR | Status: DC | PRN
  Administered 2017-07-18: 2 mg via INTRAVENOUS

## 2017-07-18 MED ORDER — DEXAMETHASONE SODIUM PHOSPHATE 10 MG/ML IJ SOLN
INTRAMUSCULAR | Status: AC
  Filled 2017-07-18: qty 1

## 2017-07-18 MED ORDER — LACTATED RINGERS IV SOLN: 500.0000 mL | Freq: Once | INTRAVENOUS | Status: DC

## 2017-07-18 MED ORDER — SOD CITRATE-CITRIC ACID 500-334 MG/5ML PO SOLN
30.0000 mL | ORAL | Status: DC | PRN
  Filled 2017-07-18: qty 15

## 2017-07-18 MED ORDER — SCOPOLAMINE 1 MG/3DAYS TD PT72
MEDICATED_PATCH | TRANSDERMAL | Status: DC | PRN
  Administered 2017-07-18: 1 via TRANSDERMAL

## 2017-07-18 SURGICAL SUPPLY — 35 items
BARRIER ADHS 3X4 INTERCEED (GAUZE/BANDAGES/DRESSINGS) IMPLANT
BENZOIN TINCTURE PRP APPL 2/3 (GAUZE/BANDAGES/DRESSINGS) ×3 IMPLANT
CHLORAPREP W/TINT 26ML (MISCELLANEOUS) ×3 IMPLANT
CLAMP CORD UMBIL (MISCELLANEOUS) IMPLANT
CLOSURE WOUND 1/2 X4 (GAUZE/BANDAGES/DRESSINGS) ×1
CLOTH BEACON ORANGE TIMEOUT ST (SAFETY) ×3 IMPLANT
CONTAINER PREFILL 10% NBF 15ML (MISCELLANEOUS) IMPLANT
DRSG OPSITE POSTOP 4X10 (GAUZE/BANDAGES/DRESSINGS) ×3 IMPLANT
ELECT REM PT RETURN 9FT ADLT (ELECTROSURGICAL) ×3
ELECTRODE REM PT RTRN 9FT ADLT (ELECTROSURGICAL) ×1 IMPLANT
EXTRACTOR VACUUM M CUP 4 TUBE (SUCTIONS) IMPLANT
EXTRACTOR VACUUM M CUP 4' TUBE (SUCTIONS)
GLOVE BIO SURGEON STRL SZ 6.5 (GLOVE) ×2 IMPLANT
GLOVE BIO SURGEONS STRL SZ 6.5 (GLOVE) ×1
GLOVE BIOGEL PI IND STRL 7.0 (GLOVE) ×1 IMPLANT
GLOVE BIOGEL PI INDICATOR 7.0 (GLOVE) ×2
GOWN STRL REUS W/TWL LRG LVL3 (GOWN DISPOSABLE) ×6 IMPLANT
KIT ABG SYR 3ML LUER SLIP (SYRINGE) IMPLANT
NEEDLE ANCHOR KEITH 2 7/8 STR (NEEDLE) ×3 IMPLANT
NEEDLE HYPO 22GX1.5 SAFETY (NEEDLE) IMPLANT
NEEDLE HYPO 25X5/8 SAFETYGLIDE (NEEDLE) ×3 IMPLANT
NS IRRIG 1000ML POUR BTL (IV SOLUTION) ×3 IMPLANT
PACK C SECTION WH (CUSTOM PROCEDURE TRAY) ×3 IMPLANT
PAD OB MATERNITY 4.3X12.25 (PERSONAL CARE ITEMS) ×3 IMPLANT
PENCIL SMOKE EVAC W/HOLSTER (ELECTROSURGICAL) ×3 IMPLANT
STRIP CLOSURE SKIN 1/2X4 (GAUZE/BANDAGES/DRESSINGS) ×2 IMPLANT
SUT CHROMIC 0 CTX 36 (SUTURE) ×6 IMPLANT
SUT PLAIN 0 NONE (SUTURE) IMPLANT
SUT PLAIN 2 0 XLH (SUTURE) IMPLANT
SUT VIC AB 0 CT1 27 (SUTURE) ×6
SUT VIC AB 0 CT1 27XBRD ANBCTR (SUTURE) ×3 IMPLANT
SUT VIC AB 4-0 KS 27 (SUTURE) IMPLANT
SYR CONTROL 10ML LL (SYRINGE) IMPLANT
TOWEL OR 17X24 6PK STRL BLUE (TOWEL DISPOSABLE) ×3 IMPLANT
TRAY FOLEY BAG SILVER LF 14FR (SET/KITS/TRAYS/PACK) ×3 IMPLANT

## 2017-07-18 NOTE — H&P (Signed)
Ashley PortelaBrieanne M Calderon is a 26 y.o. G 1 P 0 at 3839 w 4 days present for IOL secondary to favorable cervix. Patient request GBBS +  OB History    Gravida Para Term Preterm AB Living   1             SAB TAB Ectopic Multiple Live Births                 Past Medical History:  Diagnosis Date  . Anxiety    panic attacks  . Depression   . Mononucleosis April 2010   Past Surgical History:  Procedure Laterality Date  . ganglion cyst removed     left wrist    Family History: family history includes Breast cancer in her paternal grandmother; Myasthenia gravis in her father; Non-Hodgkin's lymphoma in her maternal grandmother; Skin cancer in her maternal aunt and maternal grandfather; Stroke in her maternal grandfather. Social History:  reports that  has never smoked. she has never used smokeless tobacco. She reports that she does not drink alcohol or use drugs.     Maternal Diabetes: No Genetic Screening: Normal Maternal Ultrasounds/Referrals: Normal Fetal Ultrasounds or other Referrals:  None Maternal Substance Abuse:  No Significant Maternal Medications:  None Significant Maternal Lab Results:  None Other Comments:  None  Review of Systems  All other systems reviewed and are negative.  History Dilation: 3 Effacement (%): 90 Station: -1 Exam by:: Gustavia Carie MD Temperature 97.7 F (36.5 C), temperature source Oral, last menstrual period 10/14/2016. Maternal Exam:  Abdomen: Fetal presentation: vertex     Fetal Exam Fetal State Assessment: Category I - tracings are normal.     Physical Exam  Nursing note and vitals reviewed. Constitutional: She appears well-developed.  HENT:  Head: Normocephalic.  Eyes: Pupils are equal, round, and reactive to light.  Neck: Normal range of motion.  Cardiovascular: Normal rate and regular rhythm.  Respiratory: Effort normal.  GI: Soft.  Genitourinary: Vagina normal.    Prenatal labs: ABO, Rh: B/Positive/-- (04/10 0000) Antibody:  Negative (04/10 0000) Rubella: Immune (04/10 0000) RPR: Nonreactive (04/10 0000)  HBsAg: Negative (04/10 0000)  HIV: Non-reactive (04/10 0000)  GBS: Positive (04/10 0000)   Assessment/Plan: IUP at 39 w 4 days Pitocin prn Epidural Clindamycin for GBBS +   Garl Speigner L 07/18/2017, 1:59 PM

## 2017-07-18 NOTE — Anesthesia Procedure Notes (Signed)
Epidural Patient location during procedure: OB Start time: 07/18/2017 3:08 PM End time: 07/18/2017 3:32 PM  Staffing Anesthesiologist: Lowella CurbMiller, Earland Reish Ray, MD Performed: anesthesiologist   Preanesthetic Checklist Completed: patient identified, site marked, surgical consent, pre-op evaluation, timeout performed, IV checked, risks and benefits discussed and monitors and equipment checked  Epidural Patient position: sitting Prep: ChloraPrep Patient monitoring: heart rate, cardiac monitor, continuous pulse ox and blood pressure Approach: midline Location: L2-L3 Injection technique: LOR saline  Needle:  Needle type: Tuohy  Needle gauge: 17 G Needle length: 9 cm Needle insertion depth: 5 cm Catheter type: closed end flexible Catheter size: 20 Guage Catheter at skin depth: 9 cm Test dose: negative  Assessment Events: blood not aspirated, injection not painful, no injection resistance, negative IV test and no paresthesia  Additional Notes Reason for block:procedure for pain

## 2017-07-18 NOTE — Anesthesia Pain Management Evaluation Note (Signed)
  [facility] Pain Management Visit Note  Patient: Ashley PortelaBrieanne M Abdallah, 26 y.o., female  "Hello [facility] am a [facility]. We have an anesthesia team available at all times to provide care throughout the hospital, including epidural management and anesthesia for C-section. I don't know your plan for the delivery whether it a natural birth, water birth, IV sedation, nitrous supplementation, doula or epidural, but we want to meet your pain goals."   1.Was your pain managed to your expectations on prior hospitalizations?   No prior hospitalizations  2.What is your expectation for pain management during this hospitalization?     Epidural  3.How can we help you reach that goal?   Record the patient's initial score and the patient's pain goal.   Pain: 0  Pain Goal: 4 [facility] wants you to be able to say your pain was always managed very well.  Laban EmperorMalinova,Leela Vanbrocklin Hristova 07/18/2017

## 2017-07-18 NOTE — Progress Notes (Signed)
Patient ID: Gretchen PortelaBrieanne M Gawlik, female   DOB: [birth year], 26 y.o.   MRN: [id number]  Called to patient's room due to prolonged bradycardia. On my arrival, 6 minutes into bradycardia. Terbutaline 0.25mg  given. FSE placed. Dr Vincente PoliGrewal arrived and assumed care.  Levie HeritageJacob J Oakes Mccready, DO 2:52 PM  07/18/2017

## 2017-07-18 NOTE — Anesthesia Postprocedure Evaluation (Signed)
Anesthesia Post Note  Patient: Ashley Calderon  Procedure(s) Performed: CESAREAN SECTION (N/A )     Anesthesia Post Evaluation  Last Vitals:  Vitals:   07/18/17 1725 07/18/17 1733  BP:  (!) 109/51  Pulse: (!) 112 (!) 108  Resp: 20 20  Temp:  36.7 C  SpO2: 99% 100%    Last Pain:  Vitals:   07/18/17 1733  TempSrc: Oral  PainSc: 0-No pain   Pain Goal:                 Lowella CurbWarren Ray Lorelie Biermann

## 2017-07-18 NOTE — Anesthesia Preprocedure Evaluation (Signed)

## 2017-07-18 NOTE — Transfer of Care (Signed)
Immediate Anesthesia Transfer of Care Note  Patient: Gretchen PortelaBrieanne M Meland  Procedure(s) Performed: CESAREAN SECTION (N/A )  Patient Location: PACU  Anesthesia Type:Epidural  Level of Consciousness: awake, alert  and oriented  Airway & Oxygen Therapy: Patient Spontanous Breathing  Post-op Assessment: Report given to RN and Post -op Vital signs reviewed and stable  Post vital signs: Reviewed and stable  Last Vitals:  Vitals:   07/18/17 1545 07/18/17 1547  BP: (!) 93/58 (!) 161/126  Pulse:  (!) 55  Resp: 18 18  Temp:    SpO2:      Last Pain:  Vitals:   07/18/17 1459  TempSrc:   PainSc: 0-No pain         Complications: No apparent anesthesia complications

## 2017-07-18 NOTE — Brief Op Note (Signed)
07/18/2017  4:34 PM  PATIENT:  Ashley Calderon  26 y.o. female  PRE-OPERATIVE DIAGNOSIS:  IUP at 3839 w 4 days Fetal Distress  POST-OPERATIVE DIAGNOSIS: Same Hypercoiled Cord  PROCEDURE:  Procedure(s): CESAREAN SECTION (N/A)  SURGEON:  Surgeon(s) and Role:    * Marcelle OverlieGrewal, Chandon Lazcano, MD - Primary  PHYSICIAN ASSISTANT:   ASSISTANTS: none   ANESTHESIA:   spinal  EBL:  370 mL   BLOOD ADMINISTERED:none  DRAINS: Urinary Catheter (Foley)   LOCAL MEDICATIONS USED:  NONE  SPECIMEN:  Source of Specimen:  placenta  DISPOSITION OF SPECIMEN:  PATHOLOGY  COUNTS:  YES  TOURNIQUET:  * No tourniquets in log *  DICTATION: .Other Dictation: Dictation Number [id number]  PLAN OF CARE: Admit to inpatient   PATIENT DISPOSITION:  PACU - hemodynamically stable.   Delay start of Pharmacological VTE agent (>24hrs) due to surgical blood loss or risk of bleeding: not applicable

## 2017-07-19 LAB — CBC
HCT: 26.6 % — ABNORMAL LOW (ref 36.0–46.0)
Hemoglobin: 9.1 g/dL — ABNORMAL LOW (ref 12.0–15.0)
MCH: 27.1 pg (ref 26.0–34.0)
MCHC: 34.2 g/dL (ref 30.0–36.0)
MCV: 79.2 fL (ref 78.0–100.0)
Platelets: 183 10*3/uL (ref 150–400)
RBC: 3.36 MIL/uL — ABNORMAL LOW (ref 3.87–5.11)
RDW: 13.9 % (ref 11.5–15.5)
WBC: 21 10*3/uL — AB (ref 4.0–10.5)

## 2017-07-19 LAB — RPR: RPR: NONREACTIVE

## 2017-07-19 LAB — RUBELLA SCREEN: Rubella: 5.69 index (ref >0.99)

## 2017-07-19 NOTE — Progress Notes (Signed)
Subjective: Postpartum Day 1: Cesarean Delivery Patient reports incisional pain and tolerating PO.    Objective: Vital signs in last 24 hours: Temp:  [97.7 F (36.5 C)-100.6 F (38.1 C)] 98.3 F (36.8 C) (11/14 0559) Pulse Rate:  [55-146] 82 (11/14 0559) Resp:  [18-26] 18 (11/14 0559) BP: (69-161)/(45-126) 107/58 (11/14 0559) SpO2:  [96 %-100 %] 97 % (11/14 0720) Weight:  [198 lb (89.8 kg)] 198 lb (89.8 kg) (11/13 1459)  Physical Exam:  General: alert, cooperative, appears stated age and no distress Lochia: appropriate Uterine Fundus: firm Incision: healing well DVT Evaluation: No evidence of DVT seen on physical exam.  Recent Labs    07/18/17 1355 07/19/17 0621  HGB 11.5* 9.1*  HCT 32.7* 26.6*    Assessment/Plan: Status post Cesarean section. Doing well postoperatively.  Elevated WBC, but afebrile and nontender.  Recheck CBC in am.  Ashley Calderon C 07/19/2017, 9:54 AM

## 2017-07-19 NOTE — Op Note (Signed)
NAME:  Ashley Calderon, Ashley Calderon             ACCOUNT NO.:  [id number]  MEDICAL RECORD NO.:  [id number]  LOCATION:                                 FACILITY:  PHYSICIAN:  Rhyanna Sorce [name], M.D.    DATE OF BIRTH:  DATE OF PROCEDURE:  07/18/2017 DATE OF DISCHARGE:  07/20/2017                              OPERATIVE REPORT   PREOPERATIVE DIAGNOSES:  Intrauterine pregnancy at 39 weeks and 4 days and fetal distress.  POSTOPERATIVE DIAGNOSES:  Intrauterine pregnancy at 39 weeks and 4 days and fetal distress and short umbilical cord.  PROCEDURE:  Primary low transverse cesarean section.  ANESTHESIA:  Spinal.  ESTIMATED BLOOD LOSS:  300 mL.  COMPLICATIONS:  None.  DRAINS:  Foley catheter.  PROCEDURE IN DETAIL:  The patient was taken to the operating room after 2 episodes of prolonged bradycardia were noted while in Labor and Delivery.  She was taken to the operating room.  She was then prepped and draped and a time-out was performed.  Allis test was done.  A low- transverse incision was made, carried down to the fascia.  Fascia scored in the midline and extended laterally.  The rectus muscles were separated in the midline.  The peritoneum was entered bluntly.  The peritoneal incision was then stretched.  The bladder blade was inserted. The lower uterine segment was identified.  The bladder flap created sharply and then digitally.  The bladder blade was then readjusted.  A low-transverse incision was made in the uterus.  The uterus was entered using a hemostat.  Amniotic fluid was clear.  The baby was in cephalic presentation.  The baby was a female infant, delivered easily with a vacuum extractor with 1 pull.  The cord was noted to be short and hypocoiled.  The cord was clamped and cut.  The baby was handed to the Neonatal Team.  The placenta and cord were all removed and noted to be intact and it was sent to pathology.  The uterus was exteriorized and cleared of all clots and debris.  The  uterine incision was closed using 0 chromic in a running lock stitch.  Irrigation was performed.  The peritoneum was closed using 0 Vicryl.  The fascia was closed using 0 Vicryl starting each corner meeting in the midline.  The skin was closed with 4-0 Vicryl on a Keith needle.  All sponge, lap, and instrument counts were correct x2.  The patient went to recovery room in stable condition.     Ashley Calderon, M.D.     Florestine AversMLG/MEDQ  D:  07/18/2017  T:  07/19/2017  Job:  [id number]

## 2017-07-19 NOTE — Op Note (Signed)
NAME:  Ashley Calderon, Ashley             ACCOUNT NO.:  [id number]  MEDICAL RECORD NO.:  [id number]  LOCATION:                                 FACILITY:  PHYSICIAN:  Dillen Belmontes L. Vincente PoliGrewal, M.D.    DATE OF BIRTH:  DATE OF PROCEDURE:  07/18/2017 DATE OF DISCHARGE:                              OPERATIVE REPORT   __________     Stann MainlandMichelle L. Vincente PoliGrewal, M.D.     Florestine AversMLG/MEDQ  D:  07/18/2017  T:  07/19/2017  Job:  [id number]

## 2017-07-19 NOTE — Progress Notes (Signed)

## 2017-07-20 LAB — CBC
HCT: 26.9 % — ABNORMAL LOW (ref 36.0–46.0)
HEMOGLOBIN: 8.9 g/dL — AB (ref 12.0–15.0)
MCH: 27.1 pg (ref 26.0–34.0)
MCHC: 33.1 g/dL (ref 30.0–36.0)
MCV: 81.8 fL (ref 78.0–100.0)
PLATELETS: 152 10*3/uL (ref 150–400)
RBC: 3.29 MIL/uL — AB (ref 3.87–5.11)
RDW: 14.1 % (ref 11.5–15.5)
WBC: 12.5 10*3/uL — AB (ref 4.0–10.5)

## 2017-07-20 MED ORDER — OXYCODONE-ACETAMINOPHEN 5-325 MG PO TABS
1.0000 | ORAL_TABLET | ORAL | 0 refills | Status: DC | PRN
Start: 2017-07-20 — End: 2017-08-30

## 2017-07-20 MED ORDER — IBUPROFEN 600 MG PO TABS: 600.0000 mg | ORAL_TABLET | Freq: Four times a day (QID) | ORAL | 0 refills | Status: DC

## 2017-07-20 NOTE — Discharge Summary (Signed)
Obstetric Discharge Summary  Reason for Admission: induction of labor Prenatal Procedures: none Intrapartum Procedures: cesarean: low cervical, transverse Postpartum Procedures: none Complications-Operative and Postpartum: none Hemoglobin  Date Value Ref Range Status  07/20/2017 8.9 (L) 12.0 - 15.0 g/dL Final   HCT  Date Value Ref Range Status  07/20/2017 26.9 (L) 36.0 - 46.0 % Final    Physical Exam:  General: alert, cooperative and appears stated age 1Lochia: appropriate Uterine Fundus: firm Incision: healing well, no significant drainage, no dehiscence DVT Evaluation: No evidence of DVT seen on physical exam. Negative Homan's sign. No cords or calf tenderness.  Discharge Diagnoses: Term Pregnancy-delivered  Discharge Information: Date: 07/20/2017 Activity: pelvic rest Diet: routine Medications: PNV, Ibuprofen and Percocet Condition: stable Instructions: refer to practice specific booklet Discharge to: home   Newborn Data: Live born child  Birth Weight: 7 lb 11.8 oz (3510 g) APGAR: 9, 9  Newborn Delivery   Birth date/time:  07/18/2017 16:08:00 Delivery type:  C-Section, Vacuum Assisted C-section categorization:  Primary     Home with mother.  Ashley Calderon 07/20/2017, 1:10 PM

## 2017-07-20 NOTE — Discharge Instructions (Signed)
Call MD for T>100.4, heavy vaginal bleeding, severe abdominal pain, intractable nausea and/or vomiting, or respiratory distress.  Call office to schedule incision check in 1 week.  No driving while taking narcotics.  Pelvic rest x 6 weeks.   °

## 2017-07-20 NOTE — Progress Notes (Signed)
Patient requests early discharge.  She is meeting all goals.  Will d/c home.  Mitchel HonourMegan Kirbie Stodghill, DO

## 2017-07-20 NOTE — Progress Notes (Signed)
Subjective: Postpartum Day 2: Cesarean Delivery Patient reports tolerating PO, + flatus and no problems voiding.    Objective: Vital signs in last 24 hours: Temp:  [98.2 F (36.8 C)-98.4 F (36.9 C)] 98.2 F (36.8 C) (11/14 1650) Pulse Rate:  [73-78] 73 (11/15 0527) Resp:  [20] 20 (11/14 1650) BP: (100-110)/(54-71) 106/54 (11/15 0527) SpO2:  [99 %-100 %] 99 % (11/14 1650)  Physical Exam:  General: alert, cooperative and appears stated age Lochia: appropriate Uterine Fundus: firm Incision: healing well, no significant drainage, no dehiscence DVT Evaluation: No evidence of DVT seen on physical exam. Negative Homan's sign. No cords or calf tenderness.  Recent Labs    07/19/17 0621 07/20/17 0556  HGB 9.1* 8.9*  HCT 26.6* 26.9*    Assessment/Plan: Status post Cesarean section. Doing well postoperatively.  Continue current care.  Scorpio Fortin 07/20/2017, 9:15 AM

## 2017-08-28 ENCOUNTER — Ambulatory Visit: Payer: Self-pay | Admitting: Hematology

## 2017-08-28 NOTE — Telephone Encounter (Signed)
Status post c-section 07-18-2017.  Contacted her OBGYN about these symptoms and was told to call her pcp.  Patient C/O dizziness off and on since a few days after delivery.  Patient denies any other symptoms, except some nausea.  HX: migraines.  Currently taking Microgestin for birthcontrol. Appointment made for 08/30/17 @ 2:30 Reason for Disposition . [1] MILD dizziness (e.g., vertigo; walking normally) AND [2] has NOT been evaluated by physician for this  Answer Assessment - Initial Assessment Questions 1. DESCRIPTION: "Describe your dizziness."     Room spinning 2. VERTIGO: "Do you feel like either you or the room is spinning or tilting?"      spinning 3. LIGHTHEADED: "Do you feel lightheaded?" (e.g., somewhat faint, woozy, weak upon standing)     no 4. SEVERITY: "How bad is it?"  "Can you walk?"   - MILD - Feels unsteady but walking normally.   - MODERATE - Feels very unsteady when walking, but not falling; interferes with normal activities (e.g., school, work) .   - SEVERE - Unable to walk without falling (requires assistance).   Has to brace herself when walking 5. ONSET:  "When did the dizziness begin?"    A few days after having baby 6. AGGRAVATING FACTORS: "Does anything make it worse?" (e.g., standing, change in head position)     no  8. RECURRENT SYMPTOM: "Have you had dizziness before?" If so, ask: "When was the last time?" "What happened that time?" no 9. OTHER SYMPTOMS: "Do you have any other symptoms?" (e.g., headache, weakness, numbness, vomiting, earache) Nausea, no vomiting, history of migraines     10. PREGNANCY: "Is there any chance you are pregnant?" "When was your last menstrual period?"      Had baby on 07/18/17  Protocols used: DIZZINESS - VERTIGO-A-AH

## 2017-08-30 ENCOUNTER — Ambulatory Visit: Payer: BC Managed Care – PPO | Admitting: Family Medicine

## 2017-08-30 ENCOUNTER — Encounter: Payer: Self-pay | Admitting: Family Medicine

## 2017-08-30 VITALS — BP 100/60 | HR 88 | Temp 98.1°F | Ht 62.0 in | Wt 181.1 lb

## 2017-08-30 DIAGNOSIS — R42 Dizziness and giddiness: Secondary | ICD-10-CM | POA: Diagnosis not present

## 2017-08-30 NOTE — Patient Instructions (Signed)
Increase hydration Consider supplement with electrolyte replacement such as G2 twice daily

## 2017-08-30 NOTE — Progress Notes (Signed)
Subjective:     Patient ID: Gretchen PortelaBrieanne M Reliford, female   DOB: [birth year], 26 y.o.   MRN: [id number]  HPI Patient seen with chief complaint of dizziness. She's had some vertigo past but more recently has had some dizziness which sounds somewhat different. She has occasional  lightheadedness but his recently  Has noted with activities like walking she feels somewhat "off balance". She had C-section about 6 weeks ago. Postoperative hemoglobin 8.9 but this was checked just last week and 11.4 in GYN office.   She was started back just few days ago on Microgestin but symptoms preceded that. She has history of migraine headaches but has not had any daily headache. Generally feels she hydrates well. Denies any focal weakness. No visual changes.  Not breast-feeding. No chest pains. No dyspnea.  Past Medical History:  Diagnosis Date  . Anxiety    panic attacks  . Depression   . Mononucleosis April 2010   Past Surgical History:  Procedure Laterality Date  . CESAREAN SECTION N/A 07/18/2017   Procedure: CESAREAN SECTION;  Surgeon: Marcelle OverlieGrewal, Michelle, MD;  Location: [facility] BIRTHING SUITES;  Service: Obstetrics;  Laterality: N/A;  . ganglion cyst removed     left wrist     reports that  has never smoked. she has never used smokeless tobacco. She reports that she does not drink alcohol or use drugs. family history includes Breast cancer in her paternal grandmother; Myasthenia gravis in her father; Non-Hodgkin's lymphoma in her maternal grandmother; Skin cancer in her maternal aunt and maternal grandfather; Stroke in her maternal grandfather. Allergies  Allergen Reactions  . Doxycycline Other (See Comments)    Possible vasculitis  . Penicillins Hives    Has patient had a PCN reaction causing immediate rash, facial/tongue/throat swelling, SOB or lightheadedness with hypotension: Yes Has patient had a PCN reaction causing severe rash involving mucus membranes or skin necrosis: Yes Has patient had a PCN  reaction that required hospitalization: No Has patient had a PCN reaction occurring within the last 10 years: Yes If all of the above answers are "NO", then may proceed with Cephalosporin use.      Review of Systems  Constitutional: Negative for chills and fever.  Respiratory: Negative for shortness of breath.   Cardiovascular: Negative for chest pain.  Genitourinary: Negative for dysuria.  Neurological: Positive for dizziness, syncope and light-headedness. Negative for weakness.  Hematological: Negative for adenopathy. Does not bruise/bleed easily.       Objective:   Physical Exam  Constitutional: She is oriented to person, place, and time. She appears well-developed and well-nourished.  Eyes: Pupils are equal, round, and reactive to light.  Neck: Normal range of motion. Neck supple. No thyromegaly present.  Cardiovascular: Normal rate and regular rhythm. Exam reveals no gallop.  No murmur heard. Pulmonary/Chest: Effort normal and breath sounds normal. No respiratory distress. She has no wheezes. She has no rales.  Musculoskeletal: She exhibits no edema.  Lymphadenopathy:    She has no cervical adenopathy.  Neurological: She is alert and oriented to person, place, and time. No cranial nerve deficit. Coordination normal.  Psychiatric: She has a normal mood and affect. Her behavior is normal.       Assessment:     Patient presents 6 weeks post-partum following C-section with some intermittent dizziness. She's had vertigo in the past with this sounds somewhat different. She has standing blood pressure today left arm of 88/60 and seated 100/60. Not clear this is orthostatic though. Nonfocal neuro exam  Plan:     -Check labs with comprehensive metabolic panel and TSH. We did not check CBCs since this was checked just a week ago -Hydrate well and consider supplements such as G2 Gatorade couple times daily -Follow-up immediately for any daily headaches or any new neurologic  symptoms  Kristian CoveyBruce W Adal Sereno MD [facility]

## 2017-08-31 LAB — COMPREHENSIVE METABOLIC PANEL
ALT: 56 U/L — AB (ref 0–35)
AST: 37 U/L (ref 0–37)
Albumin: 4 g/dL (ref 3.5–5.2)
Alkaline Phosphatase: 91 U/L (ref 39–117)
BILIRUBIN TOTAL: 0.7 mg/dL (ref 0.2–1.2)
BUN: 10 mg/dL (ref 6–23)
CHLORIDE: 106 meq/L (ref 96–112)
CO2: 26 meq/L (ref 19–32)
CREATININE: 0.86 mg/dL (ref 0.40–1.20)
Calcium: 8.9 mg/dL (ref 8.4–10.5)
GFR: 85.48 mL/min (ref >60.00)
GLUCOSE: 94 mg/dL (ref 70–99)
Potassium: 4.2 mEq/L (ref 3.5–5.1)
SODIUM: 138 meq/L (ref 135–145)
Total Protein: 6.5 g/dL (ref 6.0–8.3)

## 2017-08-31 LAB — TSH: TSH: 2.31 u[IU]/mL (ref 0.35–4.50)

## 2017-09-06 ENCOUNTER — Other Ambulatory Visit: Payer: Self-pay | Admitting: *Deleted

## 2017-09-06 DIAGNOSIS — R740 Nonspecific elevation of levels of transaminase and lactic acid dehydrogenase [LDH]: Principal | ICD-10-CM

## 2017-09-06 DIAGNOSIS — R7401 Elevation of levels of liver transaminase levels: Secondary | ICD-10-CM

## 2018-10-19 ENCOUNTER — Telehealth: Payer: Self-pay

## 2018-10-19 NOTE — Telephone Encounter (Signed)
Copied from CRM [id number]. Topic: Appointment Scheduling - Transfer of Care >> Oct 19, 2018  8:21 AM Wyonia Hough E wrote: Pt is requesting to transfer FROM: Dr. Caryl Never @ [facility]  Pt is requesting to transfer TO: Dr. Elisha Headland @ [facility]  Reason for requested transfer:   Send CRM to patient's current PCP (transferring FROM).

## 2018-10-19 NOTE — Telephone Encounter (Signed)
Dr. Jimmey Ralph - Please advise on pt's request to transfer care. Thank you!

## 2018-10-19 NOTE — Telephone Encounter (Signed)
Dr. Burchette - Please advise on pt's request to transfer care. Thanks! 

## 2018-10-19 NOTE — Telephone Encounter (Signed)
OK with me.

## 2018-10-22 NOTE — Telephone Encounter (Signed)
Ok with me.  Katina Degree. Jimmey Ralph, MD 10/22/2018 9:28 AM

## 2018-10-22 NOTE — Telephone Encounter (Signed)
Okay to schedule TOC appointment

## 2018-10-24 ENCOUNTER — Ambulatory Visit: Payer: BC Managed Care – PPO | Admitting: Medical

## 2018-10-24 ENCOUNTER — Encounter: Payer: Self-pay | Admitting: Medical

## 2018-10-24 VITALS — BP 110/74 | HR 83 | Temp 98.2°F | Resp 16 | Ht 64.75 in | Wt 178.0 lb

## 2018-10-24 DIAGNOSIS — M2550 Pain in unspecified joint: Secondary | ICD-10-CM | POA: Diagnosis not present

## 2018-10-24 DIAGNOSIS — L309 Dermatitis, unspecified: Secondary | ICD-10-CM

## 2018-10-24 DIAGNOSIS — Z8261 Family history of arthritis: Secondary | ICD-10-CM

## 2018-10-24 DIAGNOSIS — Z862 Personal history of diseases of the blood and blood-forming organs and certain disorders involving the immune mechanism: Secondary | ICD-10-CM

## 2018-10-24 DIAGNOSIS — R202 Paresthesia of skin: Secondary | ICD-10-CM

## 2018-10-24 DIAGNOSIS — M2540 Effusion, unspecified joint: Secondary | ICD-10-CM | POA: Insufficient documentation

## 2018-10-24 NOTE — Progress Notes (Addendum)
Subjective:     Patient ID: Ashley Calderon, female   DOB: 1991-04-12, 28 y.o.   MRN: [id number]  HPI  Chief Complaint  Patient presents with  . NP    NP joint pain hands, ankles, fatigue X Dec 2019    Here for new joint pain in hands, ankles, and been extremely fatigue the past few months.   Last night slept 10 hours, and felt like she needed to go back to sleep.   She notes hx/o RA in family, worried about this.  Has been getting joint swelling in both hands in the knuckles (MCPs).  No wrist swelling.    Pain includes bilat hands, bilat ankles, both wrists.   Sometimes gets pain in knees and elbows but not as constant as hands, wrists and ankles.  At times has felt left ankle maybe swollen but not sure.   No recent fever, no recent weight changes.   Been feeling fatigued x 4 months.     Went to dermatology for spot on ankle a while back, diagnosed with atopic dermatitis, but lately has broken out some on wrists and ankles.   Creams given by derm for the left ankle dermatitis will help temporarily.  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   Past Medical History:  Diagnosis Date  . Anxiety    panic attacks  . Depression   . Migraine   . Mononucleosis April 2010    Past Surgical History:  Procedure Laterality Date  . CESAREAN SECTION N/A 07/18/2017   Procedure: CESAREAN SECTION;  Surgeon: Marcelle Overlie, MD;  Location: [facility] BIRTHING SUITES;  Service: Obstetrics;  Laterality: N/A;  . ganglion cyst removed     left wrist     Social History   Socioeconomic History  . Marital status: Married    Spouse name: Not on file  . Number of children: Not on file  . Years of education: Not on file  . Highest education level: Not on file  Occupational History  . Not on file  Social Needs  . Financial resource strain: Not on file  . Food insecurity:    Worry: Not on file    Inability: Not on file  . Transportation needs:    Medical: Not on file    Non-medical:  Not on file  Tobacco Use  . Smoking status: Never Smoker  . Smokeless tobacco: Never Used  Substance and Sexual Activity  . Alcohol use: Yes    Alcohol/week: 2.0 standard drinks    Types: 1 Glasses of wine, 1 Shots of liquor per week  . Drug use: No  . Sexual activity: Yes  Lifestyle  . Physical activity:    Days per week: Not on file    Minutes per session: Not on file  . Stress: Not on file  Relationships  . Social connections:    Talks on phone: Not on file    Gets together: Not on file    Attends religious service: Not on file    Active member of club or organization: Not on file    Attends meetings of clubs or organizations: Not on file    Relationship status: Not on file  . Intimate partner violence:    Fear of current or ex partner: Not on file    Emotionally abused: Not on file    Physically abused: Not on file    Forced sexual activity: Not on file  Other Topics Concern  . Not on  file  Social History Narrative   Student   Lives with mom, dad, sister and brother   [place] [employer].     Family History  Problem Relation Age of Onset  . Myasthenia gravis Father   . Thyroid disease Father   . Skin cancer Maternal Aunt   . Non-Hodgkin's lymphoma Maternal Grandmother   . Rheum arthritis Maternal Grandmother   . Stroke Maternal Grandfather   . Skin cancer Maternal Grandfather   . Cancer Maternal Grandfather        melamona  . Cancer Paternal Grandmother        breast  . Anxiety disorder Mother   . Thyroid disease Mother   . Tourette syndrome Brother   . Anxiety disorder Brother      Current Outpatient Medications:  .  norethindrone-ethinyl estradiol (MICROGESTIN,JUNEL,LOESTRIN) 1-20 MG-MCG tablet, Take 1 tablet by mouth daily., Disp: , Rfl:  .  sertraline (ZOLOFT) 25 MG tablet, Take 25 mg by mouth daily. as directed, Disp: , Rfl:   Allergies  Allergen Reactions  . Doxycycline Other (See Comments)    Possible vasculitis  . Penicillins  Hives    Has patient had a PCN reaction causing immediate rash, facial/tongue/throat swelling, SOB or lightheadedness with hypotension: Yes Has patient had a PCN reaction causing severe rash involving mucus membranes or skin necrosis: Yes Has patient had a PCN reaction that required hospitalization: No Has patient had a PCN reaction occurring within the last 10 years: Yes If all of the above answers are "NO", then may proceed with Cephalosporin use.       Review of Systems     Objective:   Physical Exam BP 110/74   Pulse 83   Temp 98.2 F (36.8 C) (Oral)   Resp 16   Ht 5' 4.75" (1.645 m)   Wt 178 lb (80.7 kg)   LMP 10/01/2018 (Approximate)   SpO2 98%   BMI 29.85 kg/m   General appearance: alert, no distress, WD/WN, white female Neck: supple, no lymphadenopathy, no thyromegaly, no masses Heart: RRR, normal S1, S2, no murmurs Lungs: CTA bilaterally, no wheezes, rhonchi, or rales MSK: No obvious joint deformities including hands wrist elbows or knees or ankles, she is tender with squeeze test of bilateral hands, tender over the MCPs throughout both hands but nontender wrist, no obvious swelling in the hands or fingers, nontender ankles no swelling of the ankles, similarly no deformity of the knees Ext: no edema Upper back over C7 with somewhat of a fullness suggestive of lipoma  Pulses: 2+ symmetric, upper and lower extremities, normal cap refill Normal strength sensation and DTRs in general      Assessment:     Encounter Diagnoses  Name Primary?  . Polyarthralgia Yes  . Joint swelling   . Family history of rheumatoid arthritis   . Dermatitis   . Paresthesia   . History of anemia        Plan:     We discussed her relatively new symptoms with joint pain and joint swelling, polyarthralgia.  She has occasional paresthesias in hands but no significant numbness or tingling on a regular basis.  She denies history of carpal tunnel and prior pregnancy.  We will check  labs as below to screen for rheumatoid disease after general lab eval given her collection of symptoms   Ashley Calderon was seen today for np.  Diagnoses and all orders for this visit:  Polyarthralgia -     Comprehensive metabolic panel -  CBC with Differential/Platelet -     CYCLIC CITRUL PEPTIDE ANTIBODY, IGG/IGA -     Rheumatoid factor -     Sedimentation rate -     ANA -     TSH  Joint swelling -     Comprehensive metabolic panel -     CBC with Differential/Platelet -     CYCLIC CITRUL PEPTIDE ANTIBODY, IGG/IGA -     Rheumatoid factor -     Sedimentation rate -     ANA -     TSH  Family history of rheumatoid arthritis -     Comprehensive metabolic panel -     CBC with Differential/Platelet -     CYCLIC CITRUL PEPTIDE ANTIBODY, IGG/IGA -     Rheumatoid factor -     Sedimentation rate -     ANA -     TSH  Dermatitis -     Comprehensive metabolic panel -     CBC with Differential/Platelet -     CYCLIC CITRUL PEPTIDE ANTIBODY, IGG/IGA -     Rheumatoid factor -     Sedimentation rate -     ANA -     TSH  Paresthesia -     Comprehensive metabolic panel -     CBC with Differential/Platelet -     CYCLIC CITRUL PEPTIDE ANTIBODY, IGG/IGA -     Rheumatoid factor -     Sedimentation rate -     ANA -     TSH  History of anemia -     Comprehensive metabolic panel -     CBC with Differential/Platelet -     CYCLIC CITRUL PEPTIDE ANTIBODY, IGG/IGA -     Rheumatoid factor -     Sedimentation rate -     ANA -     TSH

## 2018-10-25 LAB — COMPREHENSIVE METABOLIC PANEL
ALBUMIN: 4.4 g/dL (ref 3.9–5.0)
ALT: 19 IU/L (ref 0–32)
AST: 19 IU/L (ref 0–40)
Albumin/Globulin Ratio: 1.6 (ref 1.2–2.2)
Alkaline Phosphatase: 64 IU/L (ref 39–117)
BUN / CREAT RATIO: 13 (ref 9–23)
BUN: 12 mg/dL (ref 6–20)
Bilirubin Total: 0.4 mg/dL (ref 0.0–1.2)
CALCIUM: 9.5 mg/dL (ref 8.7–10.2)
CO2: 21 mmol/L (ref 20–29)
CREATININE: 0.89 mg/dL (ref 0.57–1.00)
Chloride: 103 mmol/L (ref 96–106)
GFR calc Af Amer: 103 mL/min/{1.73_m2} (ref >59)
GFR calc non Af Amer: 90 mL/min/{1.73_m2} (ref >59)
GLOBULIN, TOTAL: 2.8 g/dL (ref 1.5–4.5)
Glucose: 104 mg/dL — ABNORMAL HIGH (ref 65–99)
Potassium: 4.5 mmol/L (ref 3.5–5.2)
SODIUM: 140 mmol/L (ref 134–144)
Total Protein: 7.2 g/dL (ref 6.0–8.5)

## 2018-10-25 LAB — CBC WITH DIFFERENTIAL/PLATELET
Basophils Absolute: 0.1 10*3/uL (ref 0.0–0.2)
Basos: 1 %
EOS (ABSOLUTE): 0.1 10*3/uL (ref 0.0–0.4)
EOS: 1 %
HEMATOCRIT: 39.8 % (ref 34.0–46.6)
Hemoglobin: 13.4 g/dL (ref 11.1–15.9)
IMMATURE GRANULOCYTES: 0 %
Immature Grans (Abs): 0 10*3/uL (ref 0.0–0.1)
Lymphocytes Absolute: 2.5 10*3/uL (ref 0.7–3.1)
Lymphs: 32 %
MCH: 28.4 pg (ref 26.6–33.0)
MCHC: 33.7 g/dL (ref 31.5–35.7)
MCV: 84 fL (ref 79–97)
MONOS ABS: 0.6 10*3/uL (ref 0.1–0.9)
Monocytes: 8 %
Neutrophils Absolute: 4.5 10*3/uL (ref 1.4–7.0)
Neutrophils: 58 %
Platelets: 328 10*3/uL (ref 150–450)
RBC: 4.72 x10E6/uL (ref 3.77–5.28)
RDW: 12.7 % (ref 11.7–15.4)
WBC: 7.8 10*3/uL (ref 3.4–10.8)

## 2018-10-25 LAB — ANA: Anti Nuclear Antibody(ANA): NEGATIVE

## 2018-10-25 LAB — RHEUMATOID FACTOR: Rheumatoid fact SerPl-aCnc: <10 IU/mL (ref 0.0–13.9)

## 2018-10-25 LAB — SEDIMENTATION RATE: SED RATE: 27 mm/h (ref 0–32)

## 2018-10-25 LAB — TSH: TSH: 2.45 u[IU]/mL (ref 0.450–4.500)

## 2018-10-25 LAB — CYCLIC CITRUL PEPTIDE ANTIBODY, IGG/IGA: Cyclic Citrullin Peptide Ab: 7 units (ref 0–19)

## 2018-10-26 ENCOUNTER — Other Ambulatory Visit: Payer: Self-pay | Admitting: Medical

## 2018-10-26 DIAGNOSIS — Z8261 Family history of arthritis: Secondary | ICD-10-CM

## 2018-10-26 DIAGNOSIS — M2550 Pain in unspecified joint: Secondary | ICD-10-CM

## 2018-10-26 DIAGNOSIS — M2540 Effusion, unspecified joint: Secondary | ICD-10-CM

## 2018-10-31 ENCOUNTER — Ambulatory Visit: Payer: Managed Care, Other (non HMO) | Admitting: Medical

## 2019-08-23 ENCOUNTER — Other Ambulatory Visit: Payer: Self-pay | Admitting: Cardiology

## 2019-08-23 DIAGNOSIS — Z20822 Contact with and (suspected) exposure to covid-19: Secondary | ICD-10-CM

## 2019-08-24 LAB — NOVEL CORONAVIRUS, NAA: SARS-CoV-2, NAA: NOT DETECTED

## 2019-09-13 LAB — HM PAP SMEAR: HM Pap smear: NEGATIVE

## 2020-05-21 ENCOUNTER — Inpatient Hospital Stay (HOSPITAL_COMMUNITY)
Admission: AD | Admit: 2020-05-21 | Discharge: 2020-05-25 | DRG: 786 | Disposition: A | Discharge status: Home / Self Care | Payer: BC Managed Care – PPO | Attending: Obstetrics and Gynecology | Admitting: Obstetrics and Gynecology

## 2020-05-21 ENCOUNTER — Other Ambulatory Visit: Payer: Self-pay

## 2020-05-21 ENCOUNTER — Encounter (HOSPITAL_COMMUNITY): Payer: Self-pay | Admitting: Obstetrics and Gynecology

## 2020-05-21 DIAGNOSIS — Z98891 History of uterine scar from previous surgery: Secondary | ICD-10-CM

## 2020-05-21 DIAGNOSIS — O42913 Preterm premature rupture of membranes, unspecified as to length of time between rupture and onset of labor, third trimester: Principal | ICD-10-CM | POA: Diagnosis present

## 2020-05-21 DIAGNOSIS — Z20822 Contact with and (suspected) exposure to covid-19: Secondary | ICD-10-CM | POA: Diagnosis present

## 2020-05-21 DIAGNOSIS — R339 Retention of urine, unspecified: Secondary | ICD-10-CM | POA: Diagnosis not present

## 2020-05-21 DIAGNOSIS — O4290 Premature rupture of membranes, unspecified as to length of time between rupture and onset of labor, unspecified weeks of gestation: Secondary | ICD-10-CM | POA: Diagnosis present

## 2020-05-21 DIAGNOSIS — Z3A35 35 weeks gestation of pregnancy: Secondary | ICD-10-CM

## 2020-05-21 DIAGNOSIS — O99893 Other specified diseases and conditions complicating puerperium: Secondary | ICD-10-CM | POA: Diagnosis not present

## 2020-05-21 DIAGNOSIS — Z23 Encounter for immunization: Secondary | ICD-10-CM

## 2020-05-21 DIAGNOSIS — O34211 Maternal care for low transverse scar from previous cesarean delivery: Secondary | ICD-10-CM | POA: Diagnosis present

## 2020-05-21 NOTE — MAU Note (Signed)
PT SAYS  SROM AT 930PM- CLEAR.- FLUID STILL  COMING OUT.  VE TODAY FT. FEELS SOME CRAMPS . GBS- COLLECTED TODAY.

## 2020-05-22 ENCOUNTER — Inpatient Hospital Stay (HOSPITAL_COMMUNITY): Payer: BC Managed Care – PPO | Admitting: Anesthesiology

## 2020-05-22 ENCOUNTER — Encounter (HOSPITAL_COMMUNITY)
Admission: AD | Disposition: A | Discharge status: Home / Self Care | Payer: Self-pay | Source: Home / Self Care | Attending: Obstetrics and Gynecology

## 2020-05-22 ENCOUNTER — Encounter (HOSPITAL_COMMUNITY): Payer: Self-pay | Admitting: Obstetrics and Gynecology

## 2020-05-22 DIAGNOSIS — O34211 Maternal care for low transverse scar from previous cesarean delivery: Secondary | ICD-10-CM | POA: Diagnosis present

## 2020-05-22 DIAGNOSIS — Z3A35 35 weeks gestation of pregnancy: Secondary | ICD-10-CM | POA: Diagnosis not present

## 2020-05-22 DIAGNOSIS — O4290 Premature rupture of membranes, unspecified as to length of time between rupture and onset of labor, unspecified weeks of gestation: Secondary | ICD-10-CM | POA: Diagnosis present

## 2020-05-22 DIAGNOSIS — R339 Retention of urine, unspecified: Secondary | ICD-10-CM | POA: Diagnosis not present

## 2020-05-22 DIAGNOSIS — O99893 Other specified diseases and conditions complicating puerperium: Secondary | ICD-10-CM | POA: Diagnosis not present

## 2020-05-22 DIAGNOSIS — Z23 Encounter for immunization: Secondary | ICD-10-CM | POA: Diagnosis not present

## 2020-05-22 DIAGNOSIS — Z20822 Contact with and (suspected) exposure to covid-19: Secondary | ICD-10-CM | POA: Diagnosis present

## 2020-05-22 DIAGNOSIS — O42913 Preterm premature rupture of membranes, unspecified as to length of time between rupture and onset of labor, third trimester: Secondary | ICD-10-CM | POA: Diagnosis present

## 2020-05-22 LAB — CBC
HCT: 32.4 % — ABNORMAL LOW (ref 36.0–46.0)
HCT: 30.2 % — ABNORMAL LOW (ref 36.0–46.0)
Hemoglobin: 10.3 g/dL — ABNORMAL LOW (ref 12.0–15.0)
Hemoglobin: 9.8 g/dL — ABNORMAL LOW (ref 12.0–15.0)
MCH: 26.1 pg (ref 26.0–34.0)
MCH: 26.8 pg (ref 26.0–34.0)
MCHC: 31.8 g/dL (ref 30.0–36.0)
MCHC: 32.5 g/dL (ref 30.0–36.0)
MCV: 82 fL (ref 80.0–100.0)
MCV: 82.5 fL (ref 80.0–100.0)
Platelets: 188 10*3/uL (ref 150–400)
Platelets: 174 10*3/uL (ref 150–400)
RBC: 3.95 MIL/uL (ref 3.87–5.11)
RBC: 3.66 MIL/uL — ABNORMAL LOW (ref 3.87–5.11)
RDW: 13.2 % (ref 11.5–15.5)
RDW: 13.2 % (ref 11.5–15.5)
WBC: 9.9 10*3/uL (ref 4.0–10.5)
WBC: 16 10*3/uL — ABNORMAL HIGH (ref 4.0–10.5)
nRBC: 0 % (ref 0.0–0.2)
nRBC: 0 % (ref 0.0–0.2)

## 2020-05-22 LAB — RPR: RPR Ser Ql: NONREACTIVE

## 2020-05-22 LAB — TYPE AND SCREEN
ABO/RH(D): B POS
Antibody Screen: NEGATIVE

## 2020-05-22 LAB — SARS CORONAVIRUS 2 BY RT PCR (HOSPITAL ORDER, PERFORMED IN ~~LOC~~ HOSPITAL LAB): SARS Coronavirus 2: NEGATIVE

## 2020-05-22 LAB — POCT FERN TEST: POCT Fern Test: POSITIVE

## 2020-05-22 SURGERY — Surgical Case
Anesthesia: Spinal

## 2020-05-22 MED ORDER — PHENYLEPHRINE HCL (PRESSORS) 10 MG/ML IV SOLN
INTRAVENOUS | Status: DC | PRN
  Administered 2020-05-22 (×4): 80 ug via INTRAVENOUS

## 2020-05-22 MED ORDER — DIBUCAINE (PERIANAL) 1 % EX OINT: 1.0000 "application " | TOPICAL_OINTMENT | CUTANEOUS | Status: DC | PRN

## 2020-05-22 MED ORDER — DIPHENHYDRAMINE HCL 25 MG PO CAPS: 25.0000 mg | ORAL_CAPSULE | ORAL | Status: DC | PRN

## 2020-05-22 MED ORDER — KETOROLAC TROMETHAMINE 30 MG/ML IJ SOLN: 30.0000 mg | Freq: Four times a day (QID) | INTRAMUSCULAR | Status: AC | PRN

## 2020-05-22 MED ORDER — NALOXONE HCL 0.4 MG/ML IJ SOLN: 0.4000 mg | INTRAMUSCULAR | Status: DC | PRN

## 2020-05-22 MED ORDER — WITCH HAZEL-GLYCERIN EX PADS: 1.0000 "application " | MEDICATED_PAD | CUTANEOUS | Status: DC | PRN

## 2020-05-22 MED ORDER — ONDANSETRON HCL 4 MG/2ML IJ SOLN
INTRAMUSCULAR | Status: AC
  Filled 2020-05-22: qty 2

## 2020-05-22 MED ORDER — LACTATED RINGERS IV SOLN: INTRAVENOUS | Status: DC

## 2020-05-22 MED ORDER — SENNOSIDES-DOCUSATE SODIUM 8.6-50 MG PO TABS
2.0000 | ORAL_TABLET | ORAL | Status: DC
  Administered 2020-05-22: 2 via ORAL
  Filled 2020-05-22 (×2): qty 2

## 2020-05-22 MED ORDER — FENTANYL CITRATE (PF) 100 MCG/2ML IJ SOLN
INTRAMUSCULAR | Status: AC
  Filled 2020-05-22: qty 2

## 2020-05-22 MED ORDER — DOCUSATE SODIUM 100 MG PO CAPS: 100.0000 mg | ORAL_CAPSULE | Freq: Every day | ORAL | Status: DC

## 2020-05-22 MED ORDER — ACETAMINOPHEN 500 MG PO TABS
1000.0000 mg | ORAL_TABLET | Freq: Four times a day (QID) | ORAL | Status: AC
  Administered 2020-05-22 – 2020-05-23 (×3): 1000 mg via ORAL
  Filled 2020-05-22 (×3): qty 2

## 2020-05-22 MED ORDER — PHENYLEPHRINE 40 MCG/ML (10ML) SYRINGE FOR IV PUSH (FOR BLOOD PRESSURE SUPPORT)
PREFILLED_SYRINGE | INTRAVENOUS | Status: AC
  Filled 2020-05-22: qty 10

## 2020-05-22 MED ORDER — SIMETHICONE 80 MG PO CHEW
80.0000 mg | CHEWABLE_TABLET | Freq: Three times a day (TID) | ORAL | Status: DC
  Administered 2020-05-22 – 2020-05-25 (×9): 80 mg via ORAL
  Filled 2020-05-22 (×10): qty 1

## 2020-05-22 MED ORDER — BUPIVACAINE IN DEXTROSE 0.75-8.25 % IT SOLN
INTRATHECAL | Status: DC | PRN
  Administered 2020-05-22: 1.6 mL via INTRATHECAL

## 2020-05-22 MED ORDER — NALBUPHINE HCL 10 MG/ML IJ SOLN: 5.0000 mg | INTRAMUSCULAR | Status: DC | PRN

## 2020-05-22 MED ORDER — ONDANSETRON HCL 4 MG/2ML IJ SOLN: 4.0000 mg | Freq: Three times a day (TID) | INTRAMUSCULAR | Status: DC | PRN

## 2020-05-22 MED ORDER — CEFAZOLIN SODIUM-DEXTROSE 2-3 GM-%(50ML) IV SOLR
INTRAVENOUS | Status: DC | PRN
  Administered 2020-05-22: 2 g via INTRAVENOUS

## 2020-05-22 MED ORDER — PHENYLEPHRINE HCL-NACL 20-0.9 MG/250ML-% IV SOLN
INTRAVENOUS | Status: AC
  Filled 2020-05-22: qty 250

## 2020-05-22 MED ORDER — TRAMADOL HCL 50 MG PO TABS
50.0000 mg | ORAL_TABLET | Freq: Four times a day (QID) | ORAL | Status: DC | PRN
  Administered 2020-05-22 – 2020-05-23 (×2): 50 mg via ORAL
  Filled 2020-05-22 (×2): qty 1

## 2020-05-22 MED ORDER — FENTANYL CITRATE (PF) 100 MCG/2ML IJ SOLN
INTRAMUSCULAR | Status: DC | PRN
Start: 2020-05-22 — End: 2020-05-22
  Administered 2020-05-22: 15 ug via INTRATHECAL

## 2020-05-22 MED ORDER — ACETAMINOPHEN 10 MG/ML IV SOLN
INTRAVENOUS | Status: AC
  Filled 2020-05-22: qty 100

## 2020-05-22 MED ORDER — FENTANYL CITRATE (PF) 100 MCG/2ML IJ SOLN
25.0000 ug | INTRAMUSCULAR | Status: DC | PRN
  Administered 2020-05-22: 50 ug via INTRAVENOUS

## 2020-05-22 MED ORDER — BETAMETHASONE SOD PHOS & ACET 6 (3-3) MG/ML IJ SUSP
12.0000 mg | INTRAMUSCULAR | Status: DC
  Administered 2020-05-22: 12 mg via INTRAMUSCULAR
  Filled 2020-05-22: qty 5

## 2020-05-22 MED ORDER — DEXAMETHASONE SODIUM PHOSPHATE 4 MG/ML IJ SOLN
INTRAMUSCULAR | Status: DC | PRN
  Administered 2020-05-22: 4 mg via INTRAVENOUS

## 2020-05-22 MED ORDER — SCOPOLAMINE 1 MG/3DAYS TD PT72
MEDICATED_PATCH | TRANSDERMAL | Status: AC
  Filled 2020-05-22: qty 1

## 2020-05-22 MED ORDER — OXYTOCIN-SODIUM CHLORIDE 30-0.9 UT/500ML-% IV SOLN
INTRAVENOUS | Status: DC | PRN
  Administered 2020-05-22: 30 [IU] via INTRAVENOUS

## 2020-05-22 MED ORDER — COCONUT OIL OIL: 1.0000 "application " | TOPICAL_OIL | Status: DC | PRN

## 2020-05-22 MED ORDER — DIPHENHYDRAMINE HCL 50 MG/ML IJ SOLN: 12.5000 mg | INTRAMUSCULAR | Status: DC | PRN

## 2020-05-22 MED ORDER — DEXAMETHASONE SODIUM PHOSPHATE 10 MG/ML IJ SOLN
INTRAMUSCULAR | Status: AC
  Filled 2020-05-22: qty 1

## 2020-05-22 MED ORDER — PRENATAL MULTIVITAMIN CH
1.0000 | ORAL_TABLET | Freq: Every day | ORAL | Status: DC
  Administered 2020-05-22 – 2020-05-24 (×3): 1 via ORAL
  Filled 2020-05-22 (×4): qty 1

## 2020-05-22 MED ORDER — ACETAMINOPHEN 325 MG PO TABS: 650.0000 mg | ORAL_TABLET | ORAL | Status: DC | PRN

## 2020-05-22 MED ORDER — SIMETHICONE 80 MG PO CHEW: 80.0000 mg | CHEWABLE_TABLET | ORAL | Status: DC | PRN

## 2020-05-22 MED ORDER — SERTRALINE HCL 50 MG PO TABS
100.0000 mg | ORAL_TABLET | Freq: Every day | ORAL | Status: DC
  Administered 2020-05-22 – 2020-05-24 (×3): 100 mg via ORAL
  Filled 2020-05-22 (×4): qty 2

## 2020-05-22 MED ORDER — SCOPOLAMINE 1 MG/3DAYS TD PT72
1.0000 | MEDICATED_PATCH | Freq: Once | TRANSDERMAL | Status: AC
  Administered 2020-05-22: 1.5 mg via TRANSDERMAL

## 2020-05-22 MED ORDER — METOCLOPRAMIDE HCL 5 MG/ML IJ SOLN
INTRAMUSCULAR | Status: DC | PRN
  Administered 2020-05-22: 10 mg via INTRAVENOUS

## 2020-05-22 MED ORDER — NALBUPHINE HCL 10 MG/ML IJ SOLN: 5.0000 mg | Freq: Once | INTRAMUSCULAR | Status: DC | PRN

## 2020-05-22 MED ORDER — MORPHINE SULFATE (PF) 0.5 MG/ML IJ SOLN
INTRAMUSCULAR | Status: DC | PRN
Start: 2020-05-22 — End: 2020-05-22

## 2020-05-22 MED ORDER — OXYTOCIN-SODIUM CHLORIDE 30-0.9 UT/500ML-% IV SOLN: 2.5000 [IU]/h | INTRAVENOUS | Status: AC

## 2020-05-22 MED ORDER — SODIUM CHLORIDE 0.9% FLUSH: 3.0000 mL | INTRAVENOUS | Status: DC | PRN

## 2020-05-22 MED ORDER — TERBUTALINE SULFATE 1 MG/ML IJ SOLN
0.2500 mg | Freq: Once | INTRAMUSCULAR | Status: AC
  Administered 2020-05-22: 0.25 mg via SUBCUTANEOUS
  Filled 2020-05-22: qty 1

## 2020-05-22 MED ORDER — LACTATED RINGERS IV BOLUS
500.0000 mL | Freq: Once | INTRAVENOUS | Status: AC
  Administered 2020-05-22: 500 mL via INTRAVENOUS

## 2020-05-22 MED ORDER — IBUPROFEN 800 MG PO TABS
800.0000 mg | ORAL_TABLET | Freq: Three times a day (TID) | ORAL | Status: AC
  Administered 2020-05-22 – 2020-05-24 (×8): 800 mg via ORAL
  Filled 2020-05-22 (×9): qty 1

## 2020-05-22 MED ORDER — CALCIUM CARBONATE ANTACID 500 MG PO CHEW: 2.0000 | CHEWABLE_TABLET | ORAL | Status: DC | PRN

## 2020-05-22 MED ORDER — SOD CITRATE-CITRIC ACID 500-334 MG/5ML PO SOLN
ORAL | Status: AC
  Administered 2020-05-22: 30 mL
  Filled 2020-05-22: qty 30

## 2020-05-22 MED ORDER — SODIUM CHLORIDE 0.9 % IR SOLN
Status: DC | PRN
  Administered 2020-05-22: 1

## 2020-05-22 MED ORDER — SIMETHICONE 80 MG PO CHEW
80.0000 mg | CHEWABLE_TABLET | ORAL | Status: DC
  Administered 2020-05-22: 80 mg via ORAL
  Filled 2020-05-22 (×2): qty 1

## 2020-05-22 MED ORDER — OXYTOCIN-SODIUM CHLORIDE 30-0.9 UT/500ML-% IV SOLN
INTRAVENOUS | Status: AC
  Filled 2020-05-22: qty 500

## 2020-05-22 MED ORDER — ONDANSETRON HCL 4 MG/2ML IJ SOLN
INTRAMUSCULAR | Status: DC | PRN
  Administered 2020-05-22: 4 mg via INTRAVENOUS

## 2020-05-22 MED ORDER — PHENYLEPHRINE HCL-NACL 20-0.9 MG/250ML-% IV SOLN
INTRAVENOUS | Status: DC | PRN
  Administered 2020-05-22: 60 ug/min via INTRAVENOUS

## 2020-05-22 MED ORDER — FENTANYL CITRATE (PF) 100 MCG/2ML IJ SOLN
INTRAMUSCULAR | Status: DC | PRN
Start: 2020-05-22 — End: 2020-05-22

## 2020-05-22 MED ORDER — ACETAMINOPHEN 10 MG/ML IV SOLN
1000.0000 mg | Freq: Once | INTRAVENOUS | Status: DC | PRN
  Administered 2020-05-22: 1000 mg via INTRAVENOUS

## 2020-05-22 MED ORDER — MENTHOL 3 MG MT LOZG: 1.0000 | LOZENGE | OROMUCOSAL | Status: DC | PRN

## 2020-05-22 MED ORDER — MORPHINE SULFATE (PF) 0.5 MG/ML IJ SOLN
INTRAMUSCULAR | Status: AC
  Filled 2020-05-22: qty 10

## 2020-05-22 MED ORDER — TETANUS-DIPHTH-ACELL PERTUSSIS 5-2.5-18.5 LF-MCG/0.5 IM SUSP: 0.5000 mL | Freq: Once | INTRAMUSCULAR | Status: DC

## 2020-05-22 MED ORDER — ZOLPIDEM TARTRATE 5 MG PO TABS: 5.0000 mg | ORAL_TABLET | Freq: Every evening | ORAL | Status: DC | PRN

## 2020-05-22 MED ORDER — INFLUENZA VAC SPLIT QUAD 0.5 ML IM SUSY
0.5000 mL | PREFILLED_SYRINGE | INTRAMUSCULAR | Status: AC
  Administered 2020-05-23: 0.5 mL via INTRAMUSCULAR
  Filled 2020-05-22 (×2): qty 0.5

## 2020-05-22 MED ORDER — PRENATAL MULTIVITAMIN CH: 1.0000 | ORAL_TABLET | Freq: Every day | ORAL | Status: DC

## 2020-05-22 MED ORDER — DIPHENHYDRAMINE HCL 25 MG PO CAPS: 25.0000 mg | ORAL_CAPSULE | Freq: Four times a day (QID) | ORAL | Status: DC | PRN

## 2020-05-22 MED ORDER — SODIUM CHLORIDE 0.9 % IV SOLN: INTRAVENOUS | Status: DC | PRN

## 2020-05-22 MED ORDER — MORPHINE SULFATE (PF) 0.5 MG/ML IJ SOLN
INTRAMUSCULAR | Status: DC | PRN
Start: 2020-05-22 — End: 2020-05-22
  Administered 2020-05-22: .15 mg via INTRATHECAL

## 2020-05-22 MED ORDER — NALOXONE HCL 4 MG/10ML IJ SOLN
1.0000 ug/kg/h | INTRAVENOUS | Status: DC | PRN
  Filled 2020-05-22: qty 5

## 2020-05-22 SURGICAL SUPPLY — 34 items
BARRIER ADHS 3X4 INTERCEED (GAUZE/BANDAGES/DRESSINGS) IMPLANT
BENZOIN TINCTURE PRP APPL 2/3 (GAUZE/BANDAGES/DRESSINGS) ×3 IMPLANT
CHLORAPREP W/TINT 26ML (MISCELLANEOUS) ×3 IMPLANT
CLAMP CORD UMBIL (MISCELLANEOUS) IMPLANT
CLOSURE STERI STRIP 1/2 X4 (GAUZE/BANDAGES/DRESSINGS) ×3 IMPLANT
CLOTH BEACON ORANGE TIMEOUT ST (SAFETY) ×3 IMPLANT
DRSG OPSITE POSTOP 4X10 (GAUZE/BANDAGES/DRESSINGS) ×3 IMPLANT
ELECT REM PT RETURN 9FT ADLT (ELECTROSURGICAL) ×3
ELECTRODE REM PT RTRN 9FT ADLT (ELECTROSURGICAL) ×1 IMPLANT
EXTRACTOR VACUUM M CUP 4 TUBE (SUCTIONS) IMPLANT
EXTRACTOR VACUUM M CUP 4' TUBE (SUCTIONS)
GLOVE BIO SURGEON STRL SZ 6.5 (GLOVE) ×2 IMPLANT
GLOVE BIO SURGEONS STRL SZ 6.5 (GLOVE) ×1
GLOVE BIOGEL PI IND STRL 7.0 (GLOVE) ×1 IMPLANT
GLOVE BIOGEL PI INDICATOR 7.0 (GLOVE) ×2
GOWN STRL REUS W/TWL LRG LVL3 (GOWN DISPOSABLE) ×6 IMPLANT
KIT ABG SYR 3ML LUER SLIP (SYRINGE) IMPLANT
NEEDLE HYPO 22GX1.5 SAFETY (NEEDLE) IMPLANT
NEEDLE HYPO 25X5/8 SAFETYGLIDE (NEEDLE) ×3 IMPLANT
NS IRRIG 1000ML POUR BTL (IV SOLUTION) ×3 IMPLANT
PACK C SECTION WH (CUSTOM PROCEDURE TRAY) ×3 IMPLANT
PAD OB MATERNITY 4.3X12.25 (PERSONAL CARE ITEMS) ×3 IMPLANT
PENCIL SMOKE EVAC W/HOLSTER (ELECTROSURGICAL) ×3 IMPLANT
SUT CHROMIC 0 CTX 36 (SUTURE) ×6 IMPLANT
SUT PLAIN 0 NONE (SUTURE) IMPLANT
SUT PLAIN 2 0 XLH (SUTURE) IMPLANT
SUT VIC AB 0 CT1 27 (SUTURE) ×9
SUT VIC AB 0 CT1 27XBRD ANBCTR (SUTURE) ×3 IMPLANT
SUT VIC AB 4-0 KS 27 (SUTURE) IMPLANT
SYR CONTROL 10ML LL (SYRINGE) IMPLANT
TOWEL OR 17X24 6PK STRL BLUE (TOWEL DISPOSABLE) ×3 IMPLANT
TRAY FOLEY W/BAG SLVR 14FR LF (SET/KITS/TRAYS/PACK) ×3 IMPLANT
VACUUM CUP M-STYLE MYSTIC II (SUCTIONS) IMPLANT
WATER STERILE IRR 1000ML POUR (IV SOLUTION) ×3 IMPLANT

## 2020-05-22 NOTE — Brief Op Note (Signed)
05/22/2020  3:59 AM  PATIENT:  Ashley Calderon  29 y.o. female  PRE-OPERATIVE DIAGNOSIS:  IUP at 48 w 3 days PPROM Previous Cesarean Section Labor   POST-OPERATIVE DIAGNOSIS:  Same  PROCEDURE:  Procedure(s) with comments: REPEAT CESAREAN SECTION EDC: 06-23-20 ALLERGIES: PENICILLINS  PREVIOUS X 1  NEED RNFA (N/A) - NEED RNFA  SURGEON:  Surgeon(s) and Role:    * Marcelle Overlie, MD - Primary  PHYSICIAN ASSISTANT:   ASSISTANTS: none   ANESTHESIA:   spinal  EBL:  Less than 500   BLOOD ADMINISTERED:none  DRAINS: Urinary Catheter (Foley)   LOCAL MEDICATIONS USED:  NONE  SPECIMEN:  No Specimen  DISPOSITION OF SPECIMEN:  N/A  COUNTS:  YES  TOURNIQUET:  * No tourniquets in log *  DICTATION: .Other Dictation: Dictation Number dictated  PLAN OF CARE: Admit to inpatient   PATIENT DISPOSITION:  PACU - hemodynamically stable.   Delay start of Pharmacological VTE agent (>24hrs) due to surgical blood loss or risk of bleeding: not applicable

## 2020-05-22 NOTE — Anesthesia Procedure Notes (Signed)
Spinal  Patient location during procedure: OR Start time: 05/22/2020 3:00 AM End time: 05/22/2020 3:03 AM Staffing Performed: anesthesiologist  Anesthesiologist: Elmer Picker, MD Preanesthetic Checklist Completed: patient identified, IV checked, risks and benefits discussed, surgical consent, monitors and equipment checked, pre-op evaluation and timeout performed Spinal Block Patient position: sitting Prep: DuraPrep and site prepped and draped Patient monitoring: cardiac monitor, continuous pulse ox and blood pressure Approach: midline Location: L3-4 Injection technique: single-shot Needle Needle type: Pencan  Needle gauge: 24 G Needle length: 9 cm Assessment Sensory level: T6 Additional Notes Functioning IV was confirmed and monitors were applied. Sterile prep and drape, including hand hygiene and sterile gloves were used. The patient was positioned and the spine was prepped. The skin was anesthetized with lidocaine.  Free flow of clear CSF was obtained prior to injecting local anesthetic into the CSF.  The spinal needle aspirated freely following injection.  The needle was carefully withdrawn.  The patient tolerated the procedure well.

## 2020-05-22 NOTE — Anesthesia Postprocedure Evaluation (Signed)
Anesthesia Post Note  Patient: Ashley Calderon  Procedure(s) Performed: REPEAT CESAREAN SECTION EDC: 06-23-20 ALLERGIES: PENICILLINS  PREVIOUS X 1  NEED RNFA (N/A )     Patient location during evaluation: PACU Anesthesia Type: Spinal Level of consciousness: oriented and awake and alert Pain management: pain level controlled Vital Signs Assessment: post-procedure vital signs reviewed and stable Respiratory status: spontaneous breathing, respiratory function stable and patient connected to nasal cannula oxygen Cardiovascular status: blood pressure returned to baseline and stable Postop Assessment: no headache, no backache and no apparent nausea or vomiting Anesthetic complications: no   No complications documented.  Last Vitals:  Vitals:   05/22/20 0500 05/22/20 0550  BP: (!) 107/50 (!) 105/51  Pulse: 83 90  Resp: 14 18  Temp: 37.2 C 37.2 C  SpO2: 99%     Last Pain:  Vitals:   05/22/20 0600  TempSrc:   PainSc: 0-No pain   Pain Goal: Patients Stated Pain Goal: 2 (05/22/20 0214)              Epidural/Spinal Function Cutaneous sensation: Able to Wiggle Toes (05/22/20 0600), Patient able to flex knees: Yes (05/22/20 0600), Patient able to lift hips off bed: No (05/22/20 0600), Back pain beyond tenderness at insertion site: No (05/22/20 0600), Progressively worsening motor and/or sensory loss: No (05/22/20 0600), Bowel and/or bladder incontinence post epidural: No (05/22/20 0600)  Josaiah Muhammed L Haeleigh Streiff

## 2020-05-22 NOTE — Progress Notes (Signed)
Subjective: Postpartum Day 0: Cesarean Delivery Patient reports tolerating PO and + flatus.    Objective: Vital signs in last 24 hours: Temp:  [97.7 F (36.5 C)-99 F (37.2 C)] 98.1 F (36.7 C) (09/17 0740) Pulse Rate:  [73-103] 73 (09/17 0832) Resp:  [0-28] 18 (09/17 0832) BP: (105-131)/(50-86) 106/54 (09/17 0832) SpO2:  [97 %-100 %] 98 % (09/17 0740) Weight:  [93.4 kg] 93.4 kg (09/16 2356)  Physical Exam:  General: alert, cooperative and appears stated age Lochia: appropriate Uterine Fundus: firm Incision: healing well, no significant drainage, no dehiscence DVT Evaluation: No evidence of DVT seen on physical exam. Negative Homan's sign. No cords or calf tenderness.  Recent Labs    05/22/20 0046 05/22/20 0635  HGB 10.3* 9.8*  HCT 32.4* 30.2*    Assessment/Plan: Status post Cesarean section. Doing well postoperatively.  Continue current care.  Mitchel Honour 05/22/2020, 9:42 AM

## 2020-05-22 NOTE — Consult Note (Signed)
Neonatology Note:   Attendance at C-section:    I was asked by Dr. Vincente Poli to attend this repeat C/S at 35.[redacted] weeks EGA due to SROM. The mother is a G2P1001, GBS unk with good prenatal care uncomplicated except for maternal depression/anxiety. ROM  7 hours prior to delivery, fluid clear. Infant fairly vigorous with spontaneous cry and good tone. Needed minimal bulb suctioning. +60 sec DCC.  Brought to warmer and dried and stimulated.  HR ~100-120 bpm.  Only fair resp effort with diminished lungs sounds and and infant remained.  CPAP started and saO2 placed; fio2 titrated to maintain achieve sats.  Continue poor resp effort with increasing fio2 and declining HR to <100 thus PPV started with good response.  After 2-12min, weened back to CPAP 6cm.  Infant with WOB and moderate fio2 need ~40-60% and mild hypotonia.    Parents updated on need for NICU admission.  Ap 5/8. Transferred to NICU at ~51min of life without issues.  Father accompanied [place].    Dineen Kid Leary Roca, MD Neonatologist 05/22/2020, 5:30 AM

## 2020-05-22 NOTE — MAU Provider Note (Signed)
Pt informed that the ultrasound is considered a limited OB ultrasound and is not intended to be a complete ultrasound exam.  Patient also informed that the ultrasound is not being completed with the intent of assessing for fetal or placental anomalies or any pelvic abnormalities.  Explained that the purpose of todays ultrasound is to assess for presentation, BPP and amniotic fluid volume.  Patient acknowledges the purpose of the exam and the limitations of the study.    Fetus is noted to be in the longitudinal lie Vertex presentation

## 2020-05-22 NOTE — Anesthesia Preprocedure Evaluation (Addendum)
Anesthesia Evaluation  Patient identified by MRN, date of birth, ID band Patient awake    Reviewed: Allergy & Precautions, NPO status , Patient's Chart, lab work & pertinent test results  Airway Mallampati: II  TM Distance: >3 FB Neck ROM: Full    Dental no notable dental hx.    Pulmonary neg pulmonary ROS,    Pulmonary exam normal breath sounds clear to auscultation       Cardiovascular negative cardio ROS Normal cardiovascular exam Rhythm:Regular Rate:Normal     Neuro/Psych  Headaches, PSYCHIATRIC DISORDERS Anxiety Depression    GI/Hepatic negative GI ROS, Neg liver ROS,   Endo/Other  negative endocrine ROSObese BMI 36  Renal/GU negative Renal ROS  negative genitourinary   Musculoskeletal negative musculoskeletal ROS (+)   Abdominal   Peds  Hematology  (+) Blood dyscrasia (Hgb 10.3), anemia ,   Anesthesia Other Findings Repeat C/S, SROM  Reproductive/Obstetrics (+) Pregnancy                             Anesthesia Physical Anesthesia Plan  ASA: II and emergent  Anesthesia Plan: Spinal   Post-op Pain Management:    Induction:   PONV Risk Score and Plan: Treatment may vary due to age or medical condition  Airway Management Planned: Natural Airway  Additional Equipment:   Intra-op Plan:   Post-operative Plan:   Informed Consent: I have reviewed the patients History and Physical, chart, labs and discussed the procedure including the risks, benefits and alternatives for the proposed anesthesia with the patient or authorized representative who has indicated his/her understanding and acceptance.     Dental advisory given  Plan Discussed with: CRNA  Anesthesia Plan Comments:        Anesthesia Quick Evaluation

## 2020-05-22 NOTE — H&P (Signed)
Ashley Calderon is a 29 y.o. G 2 P 1 at 26 w 3 days presents with PPROM At 830 pm.  She was admitted with the plan of steroids given gestational age but contractions increased and she is for repeat C Section OB History    Gravida  2   Para  1   Term  1   Preterm      AB      Living  1     SAB      TAB      Ectopic      Multiple  0   Live Births  1          Past Medical History:  Diagnosis Date  . Anxiety    panic attacks  . Depression   . Migraine   . Mononucleosis April 2010   Past Surgical History:  Procedure Laterality Date  . CESAREAN SECTION N/A 07/18/2017   Procedure: CESAREAN SECTION;  Surgeon: Marcelle Overlie, MD;  Location: [facility];  Service: Obstetrics;  Laterality: N/A;  . ganglion cyst removed     left wrist    Family History: family history includes Anxiety disorder in her brother and mother; Cancer in her maternal grandfather and paternal grandmother; Myasthenia gravis in her father; Non-Hodgkin's lymphoma in her maternal grandmother; Rheum arthritis in her maternal grandmother; Skin cancer in her maternal aunt and maternal grandfather; Stroke in her maternal grandfather; Thyroid disease in her father and mother; Tourette syndrome in her brother. Social History:  reports that she has never smoked. She has never used smokeless tobacco. She reports current alcohol use of about 2.0 standard drinks of alcohol per week. She reports that she does not use drugs.     Maternal Diabetes: No Genetic Screening: Normal Maternal Ultrasounds/Referrals: Normal Fetal Ultrasounds or other Referrals:  None Maternal Substance Abuse:  No Significant Maternal Medications:  None Significant Maternal Lab Results:  None Other Comments:  None  Review of Systems  All other systems reviewed and are negative.  Maternal Medical History:  Reason for admission: Rupture of membranes and contractions.       Blood pressure 131/66, pulse (!) 102,  temperature 97.8 F (36.6 C), resp. rate 18, height 5\' 3"  (1.6 m), weight 93.4 kg, SpO2 100 %, unknown if currently breastfeeding. Maternal Exam:  Uterine Assessment: Contraction strength is moderate.  Contraction frequency is irregular.   Abdomen: Surgical scars: low transverse.   Fetal presentation: vertex     Fetal Exam Fetal State Assessment: Category I - tracings are normal.     Physical Exam  Prenatal labs: ABO, Rh: --/--/B POS (09/17 0046) Antibody: NEG (09/17 0046) Rubella:   RPR:    HBsAg:    HIV:    GBS:     Assessment/Plan: IUP at 35 w 3 days  PPROM Previous Cesarean Section  Repeat LTCS Risks are reviewed Consent is signed   10-21-1981 05/22/2020, 2:42 AM

## 2020-05-22 NOTE — Transfer of Care (Signed)
Immediate Anesthesia Transfer of Care Note  Patient: Ashley Calderon  Procedure(s) Performed: REPEAT CESAREAN SECTION EDC: 06-23-20 ALLERGIES: PENICILLINS  PREVIOUS X 1  NEED RNFA (N/A )  Patient Location: PACU  Anesthesia Type:Spinal  Level of Consciousness: awake, alert  and oriented  Airway & Oxygen Therapy: Patient Spontanous Breathing  Post-op Assessment: Report given to RN and Post -op Vital signs reviewed and stable  Post vital signs: Reviewed and stable  Last Vitals:  Vitals Value Taken Time  BP 108/50 05/22/20 0352  Temp    Pulse 88 05/22/20 0400  Resp 13 05/22/20 0400  SpO2 100 % 05/22/20 0400  Vitals shown include unvalidated device data.  Last Pain:  Vitals:   05/22/20 0214  TempSrc:   PainSc: 8       Patients Stated Pain Goal: 2 (05/22/20 0214)  Complications: No complications documented.

## 2020-05-23 MED ORDER — OXYCODONE-ACETAMINOPHEN 5-325 MG PO TABS
1.0000 | ORAL_TABLET | Freq: Four times a day (QID) | ORAL | Status: DC | PRN
  Administered 2020-05-23 – 2020-05-25 (×4): 1 via ORAL
  Filled 2020-05-23 (×5): qty 1

## 2020-05-23 NOTE — Progress Notes (Signed)
Subjective: Postpartum Day 1: Cesarean Delivery Patient reports tolerating PO and + flatus. Patient has struggled with urinary retention.  She had in and out cath yesterday and required foley placement last night.  Baby doing well in NICU with no oxygen requirement.  Objective: Vital signs in last 24 hours: Temp:  [97.5 F (36.4 C)-97.8 F (36.6 C)] 97.8 F (36.6 C) (09/18 0749) Pulse Rate:  [62-78] 63 (09/18 0749) Resp:  [16-18] 16 (09/18 0749) BP: (96-113)/(56-71) 103/71 (09/18 0749) SpO2:  [97 %-100 %] 100 % (09/18 0749)  Physical Exam:  General: alert, cooperative and appears stated age Lochia: appropriate Uterine Fundus: firm Incision: healing well, no significant drainage, no dehiscence DVT Evaluation: No evidence of DVT seen on physical exam. Negative Homan's sign. No cords or calf tenderness.  Recent Labs    05/22/20 0046 05/22/20 0635  HGB 10.3* 9.8*  HCT 32.4* 30.2*    Assessment/Plan: Status post Cesarean section. Doing well postoperatively.  Continue current care. Urinary retention-will d/c foley and recommend trying to void 2 hours after removal to avoid overfilled bladder.  Will continue to monitor.    Ashley Calderon 05/23/2020, 11:11 AM

## 2020-05-24 NOTE — Progress Notes (Signed)
Subjective: Postpartum Day 2: Cesarean Delivery Patient reports tolerating PO, + flatus and no problems voiding.  Able to void easily after catheter was removed yesterday.  Baby doing well in NICU.  Objective: Vital signs in last 24 hours: Temp:  [97.5 F (36.4 C)-98.4 F (36.9 C)] 97.5 F (36.4 C) (09/19 0839) Pulse Rate:  [66-80] 67 (09/19 0839) Resp:  [16-18] 18 (09/19 0839) BP: (106-137)/(63-98) 137/98 (09/19 0839) SpO2:  [98 %-100 %] 100 % (09/19 0839)  Physical Exam:  General: alert, cooperative and appears stated age Lochia: appropriate Uterine Fundus: firm Incision: healing well, no significant drainage, no dehiscence DVT Evaluation: No evidence of DVT seen on physical exam. Negative Homan's sign. No cords or calf tenderness.  Recent Labs    05/22/20 0046 05/22/20 0635  HGB 10.3* 9.8*  HCT 32.4* 30.2*    Assessment/Plan: Status post Cesarean section. Doing well postoperatively.  Continue current care. Postop urinary retention-resolved  Mitchel Honour 05/24/2020, 9:54 AM

## 2020-05-25 MED ORDER — IBUPROFEN 800 MG PO TABS
800.0000 mg | ORAL_TABLET | Freq: Three times a day (TID) | ORAL | 0 refills | Status: DC
Start: 2020-05-25 — End: 2021-07-19

## 2020-05-25 MED ORDER — OXYCODONE-ACETAMINOPHEN 5-325 MG PO TABS
1.0000 | ORAL_TABLET | Freq: Four times a day (QID) | ORAL | 0 refills | Status: DC | PRN
Start: 2020-05-25 — End: 2021-07-19

## 2020-05-25 NOTE — Progress Notes (Signed)
°    OB Discharge Summary     Patient Name: Ashley Calderon DOB: 1990/12/31 MRN: [id number]  Date of admission: 05/21/2020 Delivering MD: Marcelle Overlie   Date of discharge: 05/25/2020  Admitting diagnosis: Premature rupture of membranes [O42.90] S/P cesarean section [Z98.891] Intrauterine pregnancy: [redacted]w[redacted]d     Secondary diagnosis:  Active Problems:   S/P cesarean section   Premature rupture of membranes  Additional problems: history of prior C section, preterm at 35w     Discharge diagnosis: Preterm Pregnancy Delivered                                    Hospital course:  Onset of Labor With Unplanned C/S   29 y.o. yo G2P1102 at [redacted]w[redacted]d was admitted after PPROM on 05/21/2020. The patient went for cesarean section due to history of prior C section. Delivery details as follows: Membrane Rupture Time/Date: 9:30 PM ,05/22/2020   Delivery Method:C-Section, Low Vertical  Details of operation can be found in separate operative note. Patient had an uncomplicated postpartum course.  She is ambulating,tolerating a regular diet, passing flatus, and urinating well.  Patient is discharged home in stable condition 05/25/20.  Newborn Data: Birth date:05/22/2020  Birth time:3:22 AM  Gender:Female  Living status:Living  Apgars:5 ,8  Weight:3010 g   Physical exam  Vitals:   05/24/20 1825 05/24/20 1940 05/25/20 0424 05/25/20 0810  BP: 125/70 115/71 116/65 116/76  Pulse: 97 86 71 73  Resp: 18 19 18 18   Temp: 98.2 F (36.8 C) 97.9 F (36.6 C) 98 F (36.7 C) 98.4 F (36.9 C)  TempSrc: Oral Oral Oral Oral  SpO2: 99% 98% 99% 99%  Weight:      Height:       General: alert Lochia: appropriate Uterine Fundus: firm Incision: Healing well with no significant drainage DVT Evaluation: No evidence of DVT seen on physical exam. Labs: Lab Results  Component Value Date   WBC 16.0 (H) 05/22/2020   HGB 9.8 (L) 05/22/2020   HCT 30.2 (L) 05/22/2020   MCV 82.5 05/22/2020   PLT 174 05/22/2020   CMP  Latest Ref Rng & Units 10/24/2018  Glucose 65 - 99 mg/dL 10/26/2018)  BUN 6 - 20 mg/dL 12  Creatinine 725(D - 6.64 mg/dL 4.03  Sodium 4.74 - 259 mmol/L 140  Potassium 3.5 - 5.2 mmol/L 4.5  Chloride 96 - 106 mmol/L 103  CO2 20 - 29 mmol/L 21  Calcium 8.7 - 10.2 mg/dL 9.5  Total Protein 6.0 - 8.5 g/dL 7.2  Total Bilirubin 0.0 - 1.2 mg/dL 0.4  Alkaline Phos 39 - 117 IU/L 64  AST 0 - 40 IU/L 19  ALT 0 - 32 IU/L 19    Discharge instruction: per After Visit Summary and "Baby and Me Booklet".  After visit meds: percocet, zoloft  Diet: routine diet  Activity: Advance as tolerated. Pelvic rest for 6 weeks.   Outpatient follow up:6 weeks Follow up Appt:No future appointments. Follow up Visit:No follow-ups on file.  Postpartum contraception: Abstinence  Newborn Data: Live born female  Birth Weight: 6 lb 10.2 oz (3010 g) APGAR: 5, 8   Newborn Delivery   Birth date/time: 05/22/2020 03:22:00 Delivery type: C-Section, Low Vertical Trial of labor: No C-section categorization: Repeat      Baby Feeding: Bottle Disposition:NICU   05/25/2020 05/27/2020, MD

## 2020-05-25 NOTE — Clinical Social Work Maternal (Signed)
CLINICAL SOCIAL WORK MATERNAL/CHILD NOTE  Patient Details  Name: Ashley Calderon MRN: [id number] Date of Birth: 07/25/1991  Date:  2020-01-10  Clinical Social Worker Initiating Note:  Laurey Arrow Date/Time: Initiated:  05-28-20/1453     Child's Name:  Trixie Deis   Biological Parents:  Mother, Father   Need for Interpreter:  None   Reason for Referral:  Behavioral Health Concerns (Hx of anxiety/panic attacks)   Address:  [address] [place] [address]    Phone number:  [phone] (home)     Additional phone number: FOB's number [phone]  Household Members/Support Persons (HM/SP):   Household Member/Support Person 1, Household Member/Support Person 2   HM/SP Name Relationship DOB or Age  HM/SP -1 Dillin Lofgren FOB/Husband 07/24/1992  HM/SP -2 Sophie Haman daughter 07/18/2017  HM/SP -3        HM/SP -4        HM/SP -5        HM/SP -6        HM/SP -7        HM/SP -8          Natural Supports (not living in the home):  Extended Family, Immediate Family, Parent   Professional Supports: None   Employment: Full-time   Type of Work: Therapist, occupational [employer]).   Education:  Public librarian arranged:    Museum/gallery curator Resources:  Multimedia programmer   Other Resources:      Cultural/Religious Considerations Which May Impact Care:  Per [employer] Sheet, MOB is Catholic.   Strengths:  Ability to meet basic needs , Pediatrician chosen, Compliance with medical plan , Home prepared for child , Understanding of illness, Psychotropic Medications   Psychotropic Medications:  Zoloft      Pediatrician:    [place]  Pediatrician List:   [facility]      Pediatrician Fax Number:    Risk Factors/Current Problems:  Mental Health Concerns    Cognitive State:  Able to Concentrate , Alert ,  Insightful , Goal Oriented , Linear Thinking    Mood/Affect:  Comfortable , Interested , Happy , Bright , Relaxed    CSW Assessment: CSW met with MOB and FOB at infant's bedside in room 301 to complete an assessment for MH hx.  When CSW arrived, FOB was bottle feeding infant and MOB was observing their interactions. The family appeared happy and comfortable. CSW explained CSW's role and invited the couple to ask questions.   CSW inquired about MOB's MH hx and MOB acknowledged a hx of anxiety and panic attacks and reported that she was dx while in high school. 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 CSW informed MOB of possible supports and interventions to decrease PPD.  CSW also encouraged MOB to seek medical attention if needed for increased signs and symptoms of PPD.  CSW also offered MOB resources for outpatient behavioral health services and MOB declined. CSW encouraged MOB to evaluate her mental health throughout the postpartum period with the use of the New Mom Checklist developed by Postpartum Progress and notify a medical professional if symptoms arise; MOB agreed. MOB presented with insight and awareness and denied SI and HI when assessed  for safety. MOB reported having a good support team that will be willing to help if needed. CSW reviewed safe sleep and SIDS. MOB and was knowledgeable and asked appropriate questions. MOB communicated that MOB has everything she needs for the baby and is prepared to meet her infant's needs.  The couple did not have any further questions, concerns, or needs. CSW thanked the family for allowing CSW to meet with them.  CSW will continue to offer resources and supports to family while infant remains in NICU.  CSW Plan/Description:  Psychosocial Support and Ongoing Assessment of Needs, Sudden Infant Death Syndrome (SIDS)  Education, Perinatal Mood and Anxiety Disorder (PMADs) Education, Other Patient/Family Education, Other Information/Referral to [employer], MSW, [employer] Clinical Social Work [phone]  Dimple Nanas, LCSW 05/25/2020, 3:04 PM

## 2020-05-25 NOTE — Progress Notes (Signed)
Postpartum Progress Note  Subjective: Postpartum Day 3: Cesarean Delivery Patient reports tolerating PO, + flatus and no problems voiding.  Pain is well controlled with medications, denies incisional complaints. Baby in NICU. Patient is hopeful for discharge home.  Objective: Vital signs in last 24 hours: Temp:  [97.5 F (36.4 C)-98.2 F (36.8 C)] 98 F (36.7 C) (09/20 0424) Pulse Rate:  [67-97] 71 (09/20 0424) Resp:  [18-19] 18 (09/20 0424) BP: (113-137)/(65-98) 116/65 (09/20 0424) SpO2:  [98 %-100 %] 99 % (09/20 0424)  Physical Exam:  General: alert, cooperative and appears stated age Lochia: appropriate Uterine Fundus: firm Incision: healing well, no significant drainage, no dehiscence DVT Evaluation: No evidence of DVT seen on physical exam. Negative Homan's sign. No cords or calf tenderness.  No results for input(s): HGB, HCT in the last 72 hours.  Assessment/Plan: Status post Cesarean section, POD#3. Doing well postoperatively.  Discharge home today. Postop urinary retention-resolved  Lyn Henri 05/25/2020, 8:16 AM

## 2020-05-25 NOTE — Discharge Instructions (Signed)
Cesarean Delivery, Care After This sheet gives you information about how to care for yourself after your procedure. Your health care provider may also give you more specific instructions. If you have problems or questions, contact your health care provider. What can I expect after the procedure? After the procedure, it is common to have:  A small amount of blood or clear fluid coming from the incision.  Some redness, swelling, and pain in your incision area.  Some abdominal pain and soreness.  Vaginal bleeding (lochia). Even though you did not have a vaginal delivery, you will still have vaginal bleeding and discharge.  Pelvic cramps.  Fatigue. You may have pain, swelling, and discomfort in the tissue between your vagina and your anus (perineum) if:  Your C-section was unplanned, and you were allowed to labor and push.  An incision was made in the area (episiotomy) or the tissue tore during attempted vaginal delivery. Follow these instructions at home: Incision care   Follow instructions from your health care provider about how to take care of your incision. Make sure you: ? Wash your hands with soap and water before you change your bandage (dressing). If soap and water are not available, use hand sanitizer. ? If you have a dressing, change it or remove it as told by your health care provider. ? Leave stitches (sutures), skin staples, skin glue, or adhesive strips in place. These skin closures may need to stay in place for 2 weeks or longer. If adhesive strip edges start to loosen and curl up, you may trim the loose edges. Do not remove adhesive strips completely unless your health care provider tells you to do that.  Check your incision area every day for signs of infection. Check for: ? More redness, swelling, or pain. ? More fluid or blood. ? Warmth. ? Pus or a bad smell.  Do not take baths, swim, or use a hot tub until your health care provider says it's okay. Ask your health  care provider if you can take showers.  When you cough or sneeze, hug a pillow. This helps with pain and decreases the chance of your incision opening up (dehiscing). Do this until your incision heals. Medicines  Take over-the-counter and prescription medicines only as told by your health care provider.  If you were prescribed an antibiotic medicine, take it as told by your health care provider. Do not stop taking the antibiotic even if you start to feel better.  Do not drive or use heavy machinery while taking prescription pain medicine. Lifestyle  Do not drink alcohol. This is especially important if you are breastfeeding or taking pain medicine.  Do not use any products that contain nicotine or tobacco, such as cigarettes, e-cigarettes, and chewing tobacco. If you need help quitting, ask your health care provider. Eating and drinking  Drink at least 8 eight-ounce glasses of water every day unless told not to by your health care provider. If you breastfeed, you may need to drink even more water.  Eat high-fiber foods every day. These foods may help prevent or relieve constipation. High-fiber foods include: ? Whole grain cereals and breads. ? Brown rice. ? Beans. ? Fresh fruits and vegetables. Activity   If possible, have someone help you care for your baby and help with household activities for at least a few days after you leave the hospital.  Return to your normal activities as told by your health care provider. Ask your health care provider what activities are safe for   you.  Rest as much as possible. Try to rest or take a nap while your baby is sleeping.  Do not lift anything that is heavier than 10 lbs (4.5 kg), or the limit that you were told, until your health care provider says that it is safe.  Talk with your health care provider about when you can engage in sexual activity. This may depend on your: ? Risk of infection. ? How fast you heal. ? Comfort and desire to  engage in sexual activity. General instructions  Do not use tampons or douches until your health care provider approves.  Wear loose, comfortable clothing and a supportive and well-fitting bra.  Keep your perineum clean and dry. Wipe from front to back when you use the toilet.  If you pass a blood clot, save it and call your health care provider to discuss. Do not flush blood clots down the toilet before you get instructions from your health care provider.  Keep all follow-up visits for you and your baby as told by your health care provider. This is important. Contact a health care provider if:  You have: ? A fever. ? Bad-smelling vaginal discharge. ? Pus or a bad smell coming from your incision. ? Difficulty or pain when urinating. ? A sudden increase or decrease in the frequency of your bowel movements. ? More redness, swelling, or pain around your incision. ? More fluid or blood coming from your incision. ? A rash. ? Nausea. ? Little or no interest in activities you used to enjoy. ? Questions about caring for yourself or your baby.  Your incision feels warm to the touch.  Your breasts turn red or become painful or hard.  You feel unusually sad or worried.  You vomit.  You pass a blood clot from your vagina.  You urinate more than usual.  You are dizzy or light-headed. Get help right away if:  You have: ? Pain that does not go away or get better with medicine. ? Chest pain. ? Difficulty breathing. ? Blurred vision or spots in your vision. ? Thoughts about hurting yourself or your baby. ? New pain in your abdomen or in one of your legs. ? A severe headache.  You faint.  You bleed from your vagina so much that you fill more than one sanitary pad in one hour. Bleeding should not be heavier than your heaviest period. Summary  After the procedure, it is common to have pain at your incision site, abdominal cramping, and slight bleeding from your vagina.  Check  your incision area every day for signs of infection.  Tell your health care provider about any unusual symptoms.  Keep all follow-up visits for you and your baby as told by your health care provider. This information is not intended to replace advice given to you by your health care provider. Make sure you discuss any questions you have with your health care provider. Document Revised: 02/28/2018 Document Reviewed: 02/28/2018 Elsevier Patient Education  2020 [employer]  

## 2020-05-26 NOTE — Discharge Summary (Signed)
    OB Discharge Summary     Patient Name: Ashley Calderon DOB: [birth year] MRN: [id number]  Date of admission: 05/21/2020 Delivering MD: Marcelle Overlie   Date of discharge: 05/26/2020  Admitting diagnosis: Premature rupture of membranes [O42.90] S/P cesarean section [Z98.891] Intrauterine pregnancy: [redacted]w[redacted]d     Secondary diagnosis:  Active Problems:   S/P cesarean section   Premature rupture of membranes  Additional problems: history of prior C section, preterm at 35w     Discharge diagnosis: Preterm Pregnancy Delivered                                    Hospital course:  Onset of Labor With Unplanned C/S   29 y.o. yo G2P1102 at [redacted]w[redacted]d was admitted after PPROM on 05/21/2020. The patient went for cesarean section due to history of prior C section. Delivery details as follows: Membrane Rupture Time/Date: 9:30 PM ,05/22/2020   Delivery Method:C-Section, Low Vertical  Details of operation can be found in separate operative note. Patient had an uncomplicated postpartum course.  She is ambulating,tolerating a regular diet, passing flatus, and urinating well.  Patient is discharged home in stable condition 05/26/20.  Newborn Data: Birth date:05/22/2020  Birth time:3:22 AM  Gender:Female  Living status:Living  Apgars:5 ,8  Weight:3010 g   Physical exam  Vitals:   05/24/20 1825 05/24/20 1940 05/25/20 0424 05/25/20 0810  BP: 125/70 115/71 116/65 116/76  Pulse: 97 86 71 73  Resp: 18 19 18 18   Temp: 98.2 F (36.8 C) 97.9 F (36.6 C) 98 F (36.7 C) 98.4 F (36.9 C)  TempSrc: Oral Oral Oral Oral  SpO2: 99% 98% 99% 99%  Weight:      Height:       General: alert Lochia: appropriate Uterine Fundus: firm Incision: Healing well with no significant drainage DVT Evaluation: No evidence of DVT seen on physical exam. Labs: Lab Results  Component Value Date   WBC 16.0 (H) 05/22/2020   HGB 9.8 (L) 05/22/2020   HCT 30.2 (L) 05/22/2020   MCV 82.5 05/22/2020   PLT 174 05/22/2020   CMP  Latest Ref Rng & Units 10/24/2018  Glucose 65 - 99 mg/dL 10/26/2018)  BUN 6 - 20 mg/dL 12  Creatinine 941(D - 4.08 mg/dL 1.44  Sodium 8.18 - 563 mmol/L 140  Potassium 3.5 - 5.2 mmol/L 4.5  Chloride 96 - 106 mmol/L 103  CO2 20 - 29 mmol/L 21  Calcium 8.7 - 10.2 mg/dL 9.5  Total Protein 6.0 - 8.5 g/dL 7.2  Total Bilirubin 0.0 - 1.2 mg/dL 0.4  Alkaline Phos 39 - 117 IU/L 64  AST 0 - 40 IU/L 19  ALT 0 - 32 IU/L 19    Discharge instruction: per After Visit Summary and "Baby and Me Booklet".  After visit meds: percocet, zoloft  Diet: routine diet  Activity: Advance as tolerated. Pelvic rest for 6 weeks.   Outpatient follow up:6 weeks Follow up Appt:No future appointments. Follow up Visit:No follow-ups on file.  Postpartum contraception: Abstinence  Newborn Data: Live born female  Birth Weight: 6 lb 10.2 oz (3010 g) APGAR: 5, 8   Newborn Delivery   Birth date/time: 05/22/2020 03:22:00 Delivery type: C-Section, Low Vertical Trial of labor: No C-section categorization: Repeat      Baby Feeding: Bottle Disposition:NICU   05/26/2020 05/28/2020, MD

## 2020-05-26 NOTE — Op Note (Signed)
NAME: Ashley Calderon, Ashley Calderon MEDICAL RECORD [id number] ACCOUNT [id number] DATE OF BIRTH:1990/11/21 FACILITY: [facility] LOCATION: [facility] PHYSICIAN:Tryce Surratt Rosita Fire, MD  OPERATIVE REPORT  DATE OF PROCEDURE:  05/22/2020  PREOPERATIVE DIAGNOSES: 1. Intrauterine pregnancy at 35 weeks and 3 days. 2. Preterm premature rupture of membranes. 3. Previous cesarean section, in labor.  POSTOPERATIVE DIAGNOSES: 1. Intrauterine pregnancy at 35 weeks and 3 days. 2. Preterm premature rupture of membranes. 3. Previous cesarean section, in labor.  PROCEDURE:  Repeat low transverse cesarean section.  SURGEON:  Marcelle Overlie, MD  ANESTHESIA:  Spinal.  ESTIMATED BLOOD LOSS:  500 mL.  COMPLICATIONS:  None.  DRAINS:  Foley catheter.  DESCRIPTION OF PROCEDURE:  The patient was taken to the operating room.  Her spinal was placed.  She was prepped and draped in the usual sterile fashion.  A Foley catheter was inserted.  After she was prepped and draped, a low transverse incision was  made and was carried down to the fascia.  The fascia was scored in the midline and extended laterally.  There was significant amount of scar noted in the subcutaneous area.  The fascia was scored.  The rectus muscles were divided in the midline.  The  peritoneum was entered bluntly and the peritoneal incision was then stretched.  The lower uterine segment was identified.  A bladder flap was then developed with sharp and blunt dissection.  A low transverse incision was made in the uterus.  Uterus was  entered using a hemostat.  Amniotic fluid was clear.  The baby was delivered easily.  It was a female infant.  Cord was clamped and cut and the baby was handed to the waiting pediatrician and the NICU.  The placenta was intact 3-vessel ____.  The uterus  was exteriorized and cleared of all clots and debris.  The uterine incision was closed in 1 layer using chromic in a running locked stitch.  Irrigation was performed ____.   The  peritoneum was closed using 0 Vicryl.  The fascia was closed using 0 Vicryl  starting at each corner and meeting in the midline.  The subcutaneous was closed with 0 Vicryl interrupted.  All sponge, lap and instrument counts were correct x2.  The patient went to recovery room in stable condition.     VN/NUANCE  D:05/25/2020 T:05/25/2020 JOB:[id number]

## 2020-06-16 ENCOUNTER — Inpatient Hospital Stay (HOSPITAL_COMMUNITY): Admit: 2020-06-16 | Payer: Self-pay | Admitting: Obstetrics and Gynecology

## 2021-07-19 ENCOUNTER — Telehealth (INDEPENDENT_AMBULATORY_CARE_PROVIDER_SITE_OTHER): Payer: BC Managed Care – PPO | Admitting: Medical

## 2021-07-19 ENCOUNTER — Encounter: Payer: Self-pay | Admitting: Medical

## 2021-07-19 ENCOUNTER — Other Ambulatory Visit: Payer: Self-pay

## 2021-07-19 VITALS — Wt 190.0 lb

## 2021-07-19 DIAGNOSIS — J0100 Acute maxillary sinusitis, unspecified: Secondary | ICD-10-CM

## 2021-07-19 MED ORDER — CLARITHROMYCIN 500 MG PO TABS: 500.0000 mg | ORAL_TABLET | Freq: Two times a day (BID) | ORAL | 0 refills | Status: DC

## 2021-07-19 NOTE — Progress Notes (Signed)
  Subjective:     Patient ID: Ashley Calderon, female   DOB: [birth year], 30 y.o.   MRN: [id number]  This visit type was conducted due to national recommendations for restrictions regarding the COVID-19 Pandemic (e.g. social distancing) in an effort to limit this patient's exposure and mitigate transmission in our community.  Due to their co-morbid illnesses, this patient is at least at moderate risk for complications without adequate follow up.  This format is felt to be most appropriate for this patient at this time.    Documentation for virtual audio and video telecommunications through [place] encounter:  The patient was located at home. The provider was located in the office. The patient did consent to this visit and is aware of possible charges through their insurance for this visit.  The other persons participating in this telemedicine service were none. Time spent on call was 20 minutes and in review of previous records 20 minutes total.  This virtual service is not related to other E/M service within previous 7 days.   HPI Chief Complaint  Patient presents with   sinus infection    Sinus infection x 2 weeks- symptoms- sinus infection, cough, phelgm, Ha, congestion   Virtual consult for illness. Started 2 weeks ago with cold symptoms that got some better, then had continued head congestion, sinus pressure, upper teeth ache, pressure behind eyes, some coughing up phlegm, headache.  No sore throat, no sob, no  NVD.  Had low grade fever 1 night.   1 recent contact with a cold.  Not pregnant or breastfeeding.   No other aggravating or relieving factors. No other complaint.  Past Medical History:  Diagnosis Date   Anxiety    panic attacks   Depression    Migraine    Mononucleosis April 2010   Current Outpatient Medications on File Prior to Visit  Medication Sig Dispense Refill   drospirenone-ethinyl estradiol (YAZ) 3-0.02 MG tablet Loryna (28) 3 mg-0.02 mg tablet  TAKE 1  TABLET BY MOUTH ONCE DAILY     escitalopram (LEXAPRO) 10 MG tablet Take 15 mg by mouth daily.     traZODone (DESYREL) 50 MG tablet Take 50 mg by mouth at bedtime.     No current facility-administered medications on file prior to visit.    Review of Systems As in subjective     No breakfast Objective:   Physical Exam Due to coronavirus pandemic stay at home measures, patient visit was virtual and they were not examined in person.   Wt 190 lb (86.2 kg)   BMI 33.66 kg/m   General: Well-developed well-nourished no acute distress No labored breathing or wheezing, mildly ill appearing    Assessment:     Encounter Diagnosis  Name Primary?   Acute non-recurrent maxillary sinusitis Yes       Plan:     Advise rest, can continue over-the-counter supportive therapy, add nasal saline flush as discussed.  Begin antibiotic below.  If not much improved within the next 3 to 4 days or if worse or new symptoms then call back or recheck  Ashley Calderon was seen today for sinus infection.  Diagnoses and all orders for this visit:  Acute non-recurrent maxillary sinusitis  Other orders -     clarithromycin (BIAXIN) 500 MG tablet; Take 1 tablet (500 mg total) by mouth 2 (two) times daily.  F/u prn

## 2021-08-13 ENCOUNTER — Encounter: Payer: Self-pay | Admitting: Internal Medicine

## 2021-09-21 ENCOUNTER — Telehealth: Payer: Self-pay | Admitting: Medical

## 2021-09-21 NOTE — Telephone Encounter (Signed)
Pt called and states that she continues to have issues with a rash. She has been seeing a dermatologist but its not getting any better. She would like a referral to a rheumatologist to see if they can help her. Pt can be reached at [phone].

## 2021-09-21 NOTE — Telephone Encounter (Signed)
Pt is waiting on a call back from dermatology. She will schedule with [place] if dermatology doesn't do anythign for her

## 2021-11-05 ENCOUNTER — Ambulatory Visit: Payer: BC Managed Care – PPO | Admitting: Medical

## 2021-11-12 ENCOUNTER — Encounter: Payer: Self-pay | Admitting: Medical

## 2021-11-22 ENCOUNTER — Telehealth (INDEPENDENT_AMBULATORY_CARE_PROVIDER_SITE_OTHER): Payer: BC Managed Care – PPO | Admitting: Medical

## 2021-11-22 ENCOUNTER — Encounter: Payer: Self-pay | Admitting: Medical

## 2021-11-22 VITALS — Temp 98.6°F | Wt 200.0 lb

## 2021-11-22 DIAGNOSIS — J029 Acute pharyngitis, unspecified: Secondary | ICD-10-CM

## 2021-11-22 DIAGNOSIS — R0981 Nasal congestion: Secondary | ICD-10-CM

## 2021-11-22 DIAGNOSIS — J3489 Other specified disorders of nose and nasal sinuses: Secondary | ICD-10-CM

## 2021-11-22 DIAGNOSIS — J301 Allergic rhinitis due to pollen: Secondary | ICD-10-CM

## 2021-11-22 MED ORDER — AZITHROMYCIN 250 MG PO TABS: ORAL_TABLET | ORAL | 0 refills | Status: DC

## 2021-11-22 NOTE — Patient Instructions (Signed)
Recommendations ?Consider an allergy medication over-the-counter such as Zyrtec or Allegra daily at bedtime for the next 1 to 2 months since you do have some allergy symptoms ?If need be you can add a an allergy eyedrop such as Pataday over-the-counter for itchy watery eyes ?You can also add Flonase nasal spray over-the-counter for nasal congestion and stuffy nose for allergy symptoms ?For your current symptoms to suggest more of a sinus infection, I recommend nasal saline flush once or twice daily.  You can use something like Neti pot or arm and hammer saline bottle to flush your nostrils of mucus daily ?Use salt water gargles once or twice daily to flush mucus out from the back of the throat ?Drink plenty of water throughout the day to help flush out mucus ?Begin Z-Pak antibiotic which is a short course of antibiotics to clear up sinus infection ?If not significantly improving towards the end of the week let me know and we can consider other remedies ? ? ?

## 2021-11-22 NOTE — Addendum Note (Signed)
Addended by: Jac Canavan on: 11/22/2021 10:35 AM ? ? Modules accepted: Orders ? ?

## 2021-11-22 NOTE — Progress Notes (Signed)
?Subjective:  ?  ? Patient ID: Ashley Calderon, female   DOB: December 05, 1990, 31 y.o.   MRN: [id number] ? ?This visit type was conducted due to national recommendations for restrictions regarding the COVID-19 Pandemic (e.g. social distancing) in an effort to limit this patient's exposure and mitigate transmission in our community.  Due to their co-morbid illnesses, this patient is at least at moderate risk for complications without adequate follow up.  This format is felt to be most appropriate for this patient at this time.   ? ?Documentation for virtual audio and video telecommunications through [place] encounter: ? ?The patient was located at home. ?The provider was located in the office. ?The patient did consent to this visit and is aware of possible charges through their insurance for this visit. ? ?The other persons participating in this telemedicine service were none. ?Time spent on call was 20 minutes and in review of previous records 20 minutes total. ? ?This virtual service is not related to other E/M service within previous 7 days. ? ? ?HPI ?Chief Complaint  ?Patient presents with  ? Nasal Congestion  ?  Cough, congestion x 2 weeks. Otc mucinex  ? ?She notes a few weeks history of symptoms that started as allergy symptoms.  She notes ongoing head congestion, cough, nasal congestion, itchy watery eyes, fluid pressure in the ears, runny nose.  Watery eyes and runny nose particularly if she is outside.  No itchy ears. ?No wheezing or shortness of breath, no fever, no nausea vomiting or diarrhea.  She has done a COVID test at home which was negative.  No sick contacts.  She does have 2 young children under age 25 in preschool but they have not been sick.   Cough has progressed.  by end of day throat is killing her.  She is a non-smoker.  She does note some purulent nasal discharge.  Using over-the-counter Mucinex but nothing seems to help.No other aggravating or relieving factors. No other complaint. ? ?Past  Medical History:  ?Diagnosis Date  ? Anxiety   ? panic attacks  ? Depression   ? Migraine   ? Mononucleosis April 2010  ? ? ?Review of Systems ?As in subjective ?   ?Objective:  ? Physical Exam ?Due to coronavirus pandemic stay at home measures, patient visit was virtual and they were not examined in person.   ?Temp 98.6 ?F (37 ?C)   Wt 200 lb (90.7 kg)   BMI 35.43 kg/m?  ? ?General: Well-developed well-nourished no acute distress ?Stuffy sounding in the nose ?No labored breathing or wheezing ? ?   ?Assessment:  ?   ?Encounter Diagnoses  ?Name Primary?  ? Sinus congestion Yes  ? Sore throat   ? Allergic rhinitis due to pollen, unspecified seasonality   ? Purulent nasal discharge   ? ? ?   ?Plan:  ?   ?We discussed symptoms and concerns.  There is some component of allergies but she also has worsening symptoms in the past week that may be suggestive of sinus or ear eustachian tube dysfunction.   We will use the recommendations below which were discussed and sent by after visit summary. ? ?Patient Instructions  ?Recommendations ?Consider an allergy medication over-the-counter such as Zyrtec or Allegra daily at bedtime for the next 1 to 2 months since you do have some allergy symptoms ?If need be you can add a an allergy eyedrop such as Pataday over-the-counter for itchy watery eyes ?You can also add Flonase nasal spray  over-the-counter for nasal congestion and stuffy nose for allergy symptoms ?For your current symptoms to suggest more of a sinus infection, I recommend nasal saline flush once or twice daily.  You can use something like Neti pot or arm and hammer saline bottle to flush your nostrils of mucus daily ?Use salt water gargles once or twice daily to flush mucus out from the back of the throat ?Drink plenty of water throughout the day to help flush out mucus ?Begin Z-Pak antibiotic which is a short course of antibiotics to clear up sinus infection ?If not significantly improving towards the end of the week  let me know and we can consider other remedies ? ? ? ? ?Ashley Calderon was seen today for nasal congestion. ? ?Diagnoses and all orders for this visit: ? ?Sinus congestion ? ?Sore throat ? ?Allergic rhinitis due to pollen, unspecified seasonality ? ?Purulent nasal discharge ? ?Other orders ?-     azithromycin (ZITHROMAX) 250 MG tablet; 2 tablets day 1, then 1 tablet days 2-4 ? ? ? ?F/u prn ? ?

## 2022-02-01 ENCOUNTER — Ambulatory Visit: Payer: BC Managed Care – PPO | Admitting: Medical

## 2022-02-01 ENCOUNTER — Encounter: Payer: Self-pay | Admitting: Medical

## 2022-02-01 VITALS — BP 122/70 | HR 72 | Temp 97.5°F | Wt 214.2 lb

## 2022-02-01 DIAGNOSIS — M79604 Pain in right leg: Secondary | ICD-10-CM

## 2022-02-01 DIAGNOSIS — R200 Anesthesia of skin: Secondary | ICD-10-CM | POA: Diagnosis not present

## 2022-02-01 NOTE — Progress Notes (Signed)
Subjective:  Ashley Calderon is a 31 y.o. female who presents for Chief Complaint  Patient presents with   right leg numbness    Had wisdom teeth out Thursday and Thursday night Friday morning noticed right thigh is numbness and sometimes will feel like pins and needles and pain started on sunday     Here for c/o numbness in right leg.  Having some leg pain as well.  Has had some shooting pains as well in right leg.   Started last week.  Exercise - walks in general.  No recent heavy lifting or strenuous activity.    No prior similar.   No significant history of back pains.  No other paresthesia.     Had wisdom teeth taken out this past week, but no recent injury, trauma or fall.   Was sedated for the wisdom teeth procedure.  No other aggravating or relieving factors.    No other c/o.  Past Medical History:  Diagnosis Date   Anxiety    panic attacks   Depression    Migraine    Mononucleosis April 2010   Current Outpatient Medications on File Prior to Visit  Medication Sig Dispense Refill   busPIRone (BUSPAR) 10 MG tablet Take 10 mg by mouth 2 (two) times daily.     escitalopram (LEXAPRO) 10 MG tablet Take 15 mg by mouth daily.     TALTZ 80 MG/ML SOAJ Inject into the skin.     traZODone (DESYREL) 50 MG tablet Take 50 mg by mouth at bedtime.     HYDROcodone-acetaminophen (NORCO/VICODIN) 5-325 MG tablet Take 1 tablet by mouth every 6 (six) hours as needed.     LARIN FE 1/20 1-20 MG-MCG tablet Take 1 tablet by mouth daily.     No current facility-administered medications on file prior to visit.    The following portions of the patient's history were reviewed and updated as appropriate: allergies, current medications, past family history, past medical history, past social history, past surgical history and problem list.  ROS Otherwise as in subjective above    Objective: BP 122/70   Pulse 72   Temp (!) 97.5 F (36.4 C)   Wt 214 lb 3.2 oz (97.2 kg)   BMI 37.94 kg/m   Wt  Readings from Last 3 Encounters:  02/01/22 214 lb 3.2 oz (97.2 kg)  11/22/21 200 lb (90.7 kg)  07/19/21 190 lb (86.2 kg)    General appearance: alert, no distress, well developed, well nourished Seemingly tender over the right trochanteric bursa mildly, tender over the right lateral upper leg mildly but no swelling or deformity, no asymmetry of either leg, negative Homans, no discoloration, legs otherwise with normal range of motion of hips and legs in general Back nontender with normal range of motion DTRs are increased in general throughout 3+, but otherwise legs normal strength and sensation, normal heel and toe walk, negative straight leg raise Pulses: 2+ radial pulses, 2+ pedal pulses, normal cap refill Ext: no edema   Assessment: Encounter Diagnoses  Name Primary?   Pain of right lower extremity Yes   Leg numbness      Plan: We discussed possible differential which could include trochanteric bursitis, meralgia paresthetica, radicular issue from lumbar spine or other.  I suspect meralgia paresthetica.  Looking back in her chart record there has been significant weight gain over the last 2 years which would make this more likely.  She has not had any recent activity at would aggravate sciatica or other  radicular issue.  Cannot completely rule out bursitis though either.  I recommended stretching daily over the next few weeks as demonstrated.  I recommended Aleve over-the-counter once or twice a day for the next 7 to 10 days.  Can use cool therapy/ice water pack therapy 20 minutes on, 20 minutes off.  Work on losing weight through [employer] and exercise.  If worse or not improved over the next week and 1/2 to 2 weeks then let me know.  We discussed possibly adding gabapentin or getting imaging if symptoms change or worsen  I recommended a baseline wellness visit/physical.  We will request some records she had recently through dermatology  Ashley Calderon was seen today for right leg  numbness.  Diagnoses and all orders for this visit:  Pain of right lower extremity  Leg numbness    Follow up: prn, physical in the near future

## 2022-03-07 ENCOUNTER — Other Ambulatory Visit: Payer: Self-pay | Admitting: Medical

## 2022-05-11 ENCOUNTER — Encounter: Payer: Self-pay | Admitting: Internal Medicine

## 2022-06-14 ENCOUNTER — Encounter: Payer: Self-pay | Admitting: Internal Medicine

## 2022-06-20 ENCOUNTER — Encounter: Payer: Self-pay | Admitting: Neurology

## 2022-06-20 ENCOUNTER — Ambulatory Visit: Payer: BC Managed Care – PPO | Admitting: Neurology

## 2022-06-20 ENCOUNTER — Telehealth: Payer: Self-pay | Admitting: Neurology

## 2022-06-20 VITALS — BP 109/75 | HR 81 | Ht 63.0 in | Wt 211.0 lb

## 2022-06-20 DIAGNOSIS — R202 Paresthesia of skin: Secondary | ICD-10-CM

## 2022-06-20 DIAGNOSIS — M542 Cervicalgia: Secondary | ICD-10-CM | POA: Diagnosis not present

## 2022-06-20 NOTE — Progress Notes (Signed)
Chief Complaint  Patient presents with   New Patient (Initial Visit)    Rm 13. Accompanied by husband. NP proficient paper referral for right upper and lower extremity weakness, myasthenia gravis/Dr. Vickki Hearing [facility].      ASSESSMENT AND PLAN  Ashley Calderon is a 31 y.o. female   Right-sided paresthesia Right side neck pain  Normal neurological examination, splitting tuning fork at forehead and sternum, brisk reflex  Proceed with MRI of brain and cervical spine,  Patient is concerned about the possibility of structural abnormality such as MS, differentiation diagnoses also include anxiety  DIAGNOSTIC DATA (LABS, IMAGING, TESTING) - I reviewed patient records, labs, notes, testing and imaging myself where available.   MEDICAL HISTORY:  Ashley Calderon, is a 31 year old female seen in request by sports medicine Dr. Delilah Shan, [place] for evaluation of right-sided paresthesia, she is accompanied by her husband at today's visit on June 20, 2022, primary care physician's PA Tysinger, Camelia Eng,  I reviewed and summarized the referring note.PMHX. Anxiety Chronic insomnia Psoriatic-Taltz once a week,  Obesity  In June 2023, she began to notice right lateral thigh numbness, tingling, was seen by Dr. Delilah Shan, diagnosed with meralgia paresthetica, I reviewed EMG nerve conduction study Dr. Delilah Shan 07/25/2022 for right lower extremity demonstrated right sural sensory, right fibular and tibial motor responses, needle examination of right lower extremity was normal.  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  PHYSICAL EXAMNIATION:  Gen: NAD, conversant, well nourised, well groomed                     Cardiovascular: Regular rate rhythm, no peripheral edema, warm, nontender. Eyes: Conjunctivae clear without exudates or hemorrhage Neck: Supple, no carotid bruits. Pulmonary: Clear to auscultation bilaterally   NEUROLOGICAL EXAM:  MENTAL STATUS: Speech/cognition: Awake, alert, oriented to history taking and casual conversation CRANIAL NERVES: CN II: Visual fields are full to confrontation. Pupils are round equal and briskly reactive to light. CN III, IV, VI: extraocular movement are normal. No ptosis. CN V: Facial sensation is intact to light touch CN VII: Face is symmetric with normal eye closure  CN VIII: Hearing is normal to causal conversation. CN IX, X: Phonation is normal. CN XI: Head turning and shoulder shrug are intact  MOTOR: There is no pronator drift of out-stretched arms. Muscle bulk and tone are normal. Muscle strength is normal.  REFLEXES: Reflexes are 2+ and symmetric at the biceps, triceps, knees, and ankles. Plantar responses are flexor.  SENSORY: Intact to light touch, pinprick and vibratory sensation are intact in fingers and toes.  Splitting tuning fork at forehead and sternum  COORDINATION: There is no trunk or limb dysmetria noted.  GAIT/STANCE: Posture is normal. Gait  is steady with normal steps, base, arm swing, and turning. Heel and toe walking are normal. Tandem gait is normal.  Romberg is absent.  REVIEW OF SYSTEMS:  Full 14 system review of systems performed and notable only for as  above All other review of systems were negative.   ALLERGIES: Allergies  Allergen Reactions   Doxycycline Other (See Comments)    Possible vasculitis   Oxycodone Nausea And Vomiting   Penicillins Hives    Has patient had a PCN reaction causing immediate rash, facial/tongue/throat swelling, SOB or lightheadedness with hypotension: Yes Has patient had a PCN reaction causing severe rash involving mucus membranes or skin necrosis: Yes Has patient had a PCN reaction that required hospitalization: No Has patient had a PCN reaction occurring within the last 10 years: Yes If all of the above answers are "NO", then may proceed with Cephalosporin use.     HOME MEDICATIONS: Current Outpatient Medications  Medication Sig Dispense Refill   busPIRone (BUSPAR) 10 MG tablet Take 10 mg by mouth 2 (two) times daily.     escitalopram (LEXAPRO) 20 MG tablet Take 20 mg by mouth daily.     LARIN FE 1/20 1-20 MG-MCG tablet Take 1 tablet by mouth daily.     TALTZ 80 MG/ML SOAJ Inject into the skin.     traZODone (DESYREL) 50 MG tablet Take 50 mg by mouth at bedtime.     No current facility-administered medications for this visit.    PAST MEDICAL HISTORY: Past Medical History:  Diagnosis Date   Anxiety    panic attacks   Depression    Migraine    Mononucleosis 12/2008   Psoriatic arthritis (HCC)     PAST SURGICAL HISTORY: Past Surgical History:  Procedure Laterality Date   CESAREAN SECTION N/A 07/18/2017   Procedure: CESAREAN SECTION;  Surgeon: Marcelle Overlie, MD;  Location: [facility] BIRTHING SUITES;  Service: Obstetrics;  Laterality: N/A;   CESAREAN SECTION N/A 05/22/2020   Procedure: REPEAT CESAREAN SECTION EDC: 06-23-20 ALLERGIES: PENICILLINS  PREVIOUS X 1  NEED RNFA;  Surgeon: Marcelle Overlie, MD;  Location: [facility] LD ORS;  Service: Obstetrics;  Laterality: N/A;  NEED RNFA   ganglion cyst removed     left wrist     FAMILY HISTORY: Family History  Problem Relation Age of Onset   Myasthenia  gravis Father    Thyroid disease Father    Skin cancer Maternal Aunt    Non-Hodgkin's lymphoma Maternal Grandmother    Rheum arthritis Maternal Grandmother    Stroke Maternal Grandfather    Skin cancer Maternal Grandfather    Cancer Maternal Grandfather        melamona   Cancer Paternal Grandmother        breast   Anxiety disorder Mother    Thyroid disease Mother    Tourette syndrome Brother    Anxiety disorder Brother     SOCIAL HISTORY: Social History   Socioeconomic History   Marital status: Married    Spouse name: Not on file   Number of children: Not on file   Years of education: Not on file   Highest education level: Not on file  Occupational History   Not on file  Tobacco Use   Smoking status: Never   Smokeless tobacco: Never  Substance and Sexual Activity   Alcohol use: Yes    Alcohol/week: 2.0 standard drinks of alcohol    Types: 1 Glasses of wine, 1 Shots of liquor per week   Drug use: No  Sexual activity: Yes  Other Topics Concern   Not on file  Social History Narrative   Student   Lives with mom, dad, sister and brother   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    Social Determinants of Health   Financial Resource Strain: Not on file  Food Insecurity: Not on file  Transportation Needs: Not on file  Physical Activity: Not on file  Stress: Not on file  Social Connections: Not on file  Intimate Partner Violence: Not on file      Levert Feinstein, M.D. Ph.D.  [facility] [address], [address] [place], [place] [address] Ph: [phone] Fax: [phone]  CC:  Delfin Gant, MD [address] [place],  [place] [address]  Aleen Campi Kermit Balo, PA-C

## 2022-06-20 NOTE — Telephone Encounter (Signed)
Pt scheduled for MRI brain wo contrast and cervical spine wo contrast at [facility] on 06/22/22 at 1:00pm  [facility] auth # [id number] exp:06/20/22-07/19/22

## 2022-06-22 ENCOUNTER — Ambulatory Visit (INDEPENDENT_AMBULATORY_CARE_PROVIDER_SITE_OTHER): Payer: BC Managed Care – PPO

## 2022-06-22 DIAGNOSIS — M542 Cervicalgia: Secondary | ICD-10-CM

## 2022-06-22 DIAGNOSIS — R202 Paresthesia of skin: Secondary | ICD-10-CM

## 2022-06-23 ENCOUNTER — Encounter: Payer: Self-pay | Admitting: Neurology

## 2022-06-30 ENCOUNTER — Telehealth (INDEPENDENT_AMBULATORY_CARE_PROVIDER_SITE_OTHER): Payer: BC Managed Care – PPO | Admitting: Medical

## 2022-06-30 ENCOUNTER — Telehealth: Payer: Self-pay | Admitting: *Deleted

## 2022-06-30 VITALS — Ht 63.0 in | Wt 210.0 lb

## 2022-06-30 DIAGNOSIS — M2550 Pain in unspecified joint: Secondary | ICD-10-CM | POA: Diagnosis not present

## 2022-06-30 DIAGNOSIS — Z6837 Body mass index (BMI) 37.0-37.9, adult: Secondary | ICD-10-CM

## 2022-06-30 MED ORDER — QSYMIA 7.5-46 MG PO CP24: 1.0000 | ORAL_CAPSULE | ORAL | 1 refills | Status: DC

## 2022-06-30 NOTE — Telephone Encounter (Signed)
Pt cd @ front desk for p/u

## 2022-06-30 NOTE — Progress Notes (Signed)
Subjective:     Patient ID: Ashley Calderon, female   DOB: May 17, 1991, 31 y.o.   MRN: [id number]  This visit type was conducted due to national recommendations for restrictions regarding the COVID-19 Pandemic (e.g. social distancing) in an effort to limit this patient's exposure and mitigate transmission in our community.  Due to their co-morbid illnesses, this patient is at least at moderate risk for complications without adequate follow up.  This format is felt to be most appropriate for this patient at this time.    Documentation for virtual audio and video telecommunications through [place] encounter:  The patient was located at home. The provider was located in the office. The patient did consent to this visit and is aware of possible charges through their insurance for this visit.  The other persons participating in this telemedicine service were none. Time spent on call was 20 minutes and in review of previous records 20 minutes total.  This virtual service is not related to other E/M service within previous 7 days.   HPI Chief Complaint  Patient presents with   discuss weight loss    Discuss weight loss- would like get on something for weight loss   Virtual visit regarding concern for weight loss.   Is frustrated not seeing any improvement.   Exercise 3-4 miles daily, walking regularly.  Has 2 kids, husband works 24 shifts as a Airline pilot, so hard to get any other time to exercise.   Currently eating habits, intermittent fasting.  doesn't eat breakfast.  She and husband has done tracking of macros, but still not losing weight.  Currently around 210lb.   Been around this weight since 2021.  No counseling regarding food, but has seen counseling prior.   Was having some eating issues in high school with anxiety, had seen counselor then.  Hasn't seen nutritionist.  Has OCD, soe counting calories, gets obsessive about it.  No prior medication. No other aggravating or relieving  factors. No other complaint.  Past Medical History:  Diagnosis Date   Anxiety    panic attacks   Depression    Migraine    Mononucleosis 12/2008   Psoriatic arthritis ([place])    Current Outpatient Medications on File Prior to Visit  Medication Sig Dispense Refill   escitalopram (LEXAPRO) 20 MG tablet Take 20 mg by mouth daily.     LARIN FE 1/20 1-20 MG-MCG tablet Take 1 tablet by mouth daily.     TALTZ 80 MG/ML SOAJ Inject into the skin.     traZODone (DESYREL) 50 MG tablet Take 25 mg by mouth at bedtime.     No current facility-administered medications on file prior to visit.   Review of Systems As in subjective      Objective:   Physical Exam Due to coronavirus pandemic stay at home measures, patient visit was virtual and they were not examined in person.   Ht 5\' 3"  (1.6 m)   Wt 210 lb (95.3 kg)   BMI 37.20 kg/m   Wt Readings from Last 3 Encounters:  06/30/22 210 lb (95.3 kg)  06/20/22 211 lb (95.7 kg)  02/01/22 214 lb 3.2 oz (97.2 kg)   Gen: wd, wn, nad      Assessment:     Encounter Diagnoses  Name Primary?   BMI 37.0-37.9, adult Yes   Polyarthralgia        Plan:     We do this prior strategies.  We discussed diet strategies, exercise recommendations including weightbearing and  aerobic exercise at least 150 minutes or more per week.  We discussed good water intake, good sleep, decreasing stress.  We discussed different medication options including Qsymia, Contrave, phentermine, Wegovy, Although there are fewer right now.  We discussed Qsymia which she would like to try.  Discussed risk and benefits of medication proper use of medication.  Follow-up in 6 to 8 weeks.  Ashea was seen today for discuss weight loss.  Diagnoses and all orders for this visit:  BMI 37.0-37.9, adult  Polyarthralgia  Other orders -     Phentermine-Topiramate (QSYMIA) 7.5-46 MG CP24; Take 1 capsule by mouth every morning.  F/u 81mo

## 2022-07-08 ENCOUNTER — Telehealth: Payer: Self-pay | Admitting: Medical

## 2022-07-08 NOTE — Telephone Encounter (Signed)
P.A. QSYMIA 

## 2022-07-11 ENCOUNTER — Encounter (HOSPITAL_COMMUNITY): Payer: Self-pay

## 2022-07-11 ENCOUNTER — Other Ambulatory Visit (HOSPITAL_COMMUNITY): Payer: Self-pay

## 2022-07-11 ENCOUNTER — Other Ambulatory Visit: Payer: Self-pay | Admitting: Medical

## 2022-07-11 MED ORDER — TRAZODONE HCL 50 MG PO TABS
25.0000 mg | ORAL_TABLET | Freq: Every day | ORAL | 0 refills | Status: DC
  Filled 2022-07-11: qty 45, 90d supply, fill #0
  Filled 2022-10-03: qty 45, 90d supply, fill #1

## 2022-07-11 NOTE — Telephone Encounter (Signed)
From: Vivianne Spence To: Office of Dorothea Ogle, [place] Sent: 07/10/2022 8:11 AM EST Subject: Medication Renewal Request  Refills have been requested for the following medications:   traZODone (DESYREL) 50 MG tablet  Patient Comment: I am out  Preferred pharmacy: [facility] Delivery method: Mail

## 2022-07-12 ENCOUNTER — Other Ambulatory Visit (HOSPITAL_COMMUNITY): Payer: Self-pay

## 2022-07-15 NOTE — Telephone Encounter (Signed)
P.A. approved til 10/08/22 sent mychart message

## 2022-08-15 ENCOUNTER — Telehealth: Payer: Self-pay | Admitting: Adult Health

## 2022-08-15 NOTE — Telephone Encounter (Signed)
LVM and sent mychart msg informing pt of need to reschedule 09/27/22 appointment - NP out

## 2022-08-16 ENCOUNTER — Encounter: Payer: Self-pay | Admitting: Medical

## 2022-08-16 ENCOUNTER — Telehealth (INDEPENDENT_AMBULATORY_CARE_PROVIDER_SITE_OTHER): Payer: BC Managed Care – PPO | Admitting: Medical

## 2022-08-16 VITALS — Temp 97.6°F | Ht 63.0 in | Wt 211.0 lb

## 2022-08-16 DIAGNOSIS — R059 Cough, unspecified: Secondary | ICD-10-CM

## 2022-08-16 MED ORDER — HYDROCOD POLI-CHLORPHE POLI ER 10-8 MG/5ML PO SUER: 5.0000 mL | Freq: Two times a day (BID) | ORAL | 0 refills | Status: DC

## 2022-08-16 MED ORDER — BENZONATATE 100 MG PO CAPS: 100.0000 mg | ORAL_CAPSULE | Freq: Two times a day (BID) | ORAL | 0 refills | Status: DC | PRN

## 2022-08-16 NOTE — Progress Notes (Signed)
Subjective:     Patient ID: Ashley Calderon, female   DOB: Feb 21, 1991, 31 y.o.   MRN: [id number]  This visit type was conducted due to national recommendations for restrictions regarding the COVID-19 Pandemic (e.g. social distancing) in an effort to limit this patient's exposure and mitigate transmission in our community.  Due to their co-morbid illnesses, this patient is at least at moderate risk for complications without adequate follow up.  This format is felt to be most appropriate for this patient at this time.    Documentation for virtual audio and video telecommunications through [place] encounter:  The patient was located at home. The provider was located in the office. The patient did consent to this visit and is aware of possible charges through their insurance for this visit.  The other persons participating in this telemedicine service were none. Time spent on call was 20 minutes and in review of previous records 20 minutes total.  This virtual service is not related to other E/M service within previous 7 days.   HPI Chief Complaint  Patient presents with   Cough    VIRTUAL cough and PND that started 3 weeks ago. Did home covid test yesterday that was negative.   Virtual consult for cough. She requested virtual visit today.   She notes that she had some intial congestion and cough 3 weeks ago, the congestion improved, but cough has lingered.   Losing voice, lots of post nasal drip.   Getting colored mucous from cough x 5 days.  No fever, no body aches or chills.   No SOB or wheezing.  Does get some rattling cough.  No head pressure, no sore throat.    Feels fine other than nagging cough.  Using dayqil.  No hx/o lung disease, nonsmoker. No other aggravating or relieving factors. No other complaint.  Past Medical History:  Diagnosis Date   Anxiety    panic attacks   Depression    Migraine    Mononucleosis 12/2008   Psoriatic arthritis (HCC)    Current Outpatient  Medications on File Prior to Visit  Medication Sig Dispense Refill   escitalopram (LEXAPRO) 20 MG tablet Take 20 mg by mouth daily.     LARIN FE 1/20 1-20 MG-MCG tablet Take 1 tablet by mouth daily.     Phentermine-Topiramate (QSYMIA) 7.5-46 MG CP24 Take 1 capsule by mouth every morning. 30 capsule 1   TALTZ 80 MG/ML SOAJ Inject into the skin.     traZODone (DESYREL) 50 MG tablet Take 0.5 tablets (25 mg total) by mouth at bedtime. 90 tablet 0   No current facility-administered medications on file prior to visit.    Review of Systems As in subjective    Objective:   Physical Exam Due to coronavirus pandemic stay at home measures, patient visit was virtual and they were not examined in person.   Temp 97.6 F (36.4 C) (Temporal)   Ht 5\' 3"  (1.6 m)   Wt 211 lb (95.7 kg)   LMP 07/26/2022   Breastfeeding No   BMI 37.38 kg/m   Gen: wd, wn ,nad Coughing some but well-appearing, no labored breathing or wheezing     Assessment:     Encounter Diagnosis  Name Primary?   Cough, unspecified type Yes       Plan:     She does not seem ill.  She started worsening congestion 3 weeks ago but the cough is lingered.  No obvious indication for antibiotic at this time.  We discussed limitations of virtual consult  Begin Tessalon Perles during today, Tussionex at night or when not at work or driving.  Do not overlap medications within 3 to 4 hours of each other.  She can use antihistamine such as Zyrtec or plain Mucinex the next 3 to 4 days as well.  Advised salt water gargles, good water intake.  If not much improved by the end of the week or if new or worse symptoms then call back as we would probably want to listen to her lungs in person and possibly get a chest x-ray if symptoms change.  Loralee was seen today for cough.  Diagnoses and all orders for this visit:  Cough, unspecified type  Other orders -     chlorpheniramine-HYDROcodone (TUSSIONEX) 10-8 MG/5ML; Take 5 mLs by mouth 2  (two) times daily. Can cause drowsiness, don't take within 3-4 hours of Tessalon cough drop -     benzonatate (TESSALON) 100 MG capsule; Take 1 capsule (100 mg total) by mouth 2 (two) times daily as needed for cough.  Follow-up as needed

## 2022-08-18 ENCOUNTER — Other Ambulatory Visit: Payer: Self-pay | Admitting: Medical

## 2022-08-18 MED ORDER — CLARITHROMYCIN 500 MG PO TABS: 500.0000 mg | ORAL_TABLET | Freq: Two times a day (BID) | ORAL | 0 refills | Status: DC

## 2022-09-27 ENCOUNTER — Ambulatory Visit: Payer: BC Managed Care – PPO | Admitting: Adult Health

## 2022-10-03 ENCOUNTER — Encounter (HOSPITAL_COMMUNITY): Payer: Self-pay

## 2022-10-03 ENCOUNTER — Other Ambulatory Visit (HOSPITAL_COMMUNITY): Payer: Self-pay

## 2022-10-07 ENCOUNTER — Other Ambulatory Visit: Payer: Self-pay

## 2022-10-12 ENCOUNTER — Telehealth: Payer: Self-pay | Admitting: Medical

## 2022-10-12 NOTE — Telephone Encounter (Signed)
Pt called and is requesting a refill for her trazodone please send to the  [facility] [place], [place]

## 2022-10-13 ENCOUNTER — Other Ambulatory Visit: Payer: Self-pay | Admitting: Medical

## 2022-10-13 MED ORDER — TRAZODONE HCL 50 MG PO TABS: 25.0000 mg | ORAL_TABLET | Freq: Every day | ORAL | 0 refills | Status: DC

## 2022-10-13 NOTE — Telephone Encounter (Signed)
Please sign off on tradazone

## 2022-11-01 ENCOUNTER — Encounter: Payer: BC Managed Care – PPO | Admitting: Medical

## 2022-11-16 ENCOUNTER — Other Ambulatory Visit: Payer: Self-pay | Admitting: Medical

## 2022-11-17 NOTE — Telephone Encounter (Signed)
Left detailed message for pt to call to schedule a visit if she is still taking this medication

## 2023-03-20 ENCOUNTER — Encounter: Payer: Self-pay | Admitting: Internal Medicine

## 2023-03-20 ENCOUNTER — Other Ambulatory Visit: Payer: Self-pay | Admitting: Medical

## 2023-03-27 ENCOUNTER — Telehealth: Payer: BC Managed Care – PPO | Admitting: Medical

## 2023-03-27 ENCOUNTER — Other Ambulatory Visit: Payer: Self-pay | Admitting: Medical

## 2023-03-27 VITALS — Wt 205.0 lb

## 2023-03-27 DIAGNOSIS — F429 Obsessive-compulsive disorder, unspecified: Secondary | ICD-10-CM | POA: Diagnosis not present

## 2023-03-27 DIAGNOSIS — Z8659 Personal history of other mental and behavioral disorders: Secondary | ICD-10-CM | POA: Insufficient documentation

## 2023-03-27 DIAGNOSIS — F5104 Psychophysiologic insomnia: Secondary | ICD-10-CM | POA: Insufficient documentation

## 2023-03-27 NOTE — Progress Notes (Signed)
Subjective:     Patient ID: Ashley Calderon, female   DOB: 1991-08-19, 32 y.o.   MRN: [id number]  This visit type was conducted due to national recommendations for restrictions regarding the COVID-19 Pandemic (e.g. social distancing) in an effort to limit this patient's exposure and mitigate transmission in our community.  Due to their co-morbid illnesses, this patient is at least at moderate risk for complications without adequate follow up.  This format is felt to be most appropriate for this patient at this time.    Documentation for virtual audio and video telecommunications through [place] encounter:  The patient was located at home. The provider was located in the office. The patient did consent to this visit and is aware of possible charges through their insurance for this visit.  The other persons participating in this telemedicine service were none. Time spent on call was 20 minutes and in review of previous records 20 minutes total.  This virtual service is not related to other E/M service within previous 7 days.   HPI Chief Complaint  Patient presents with   Consult    Needs refill for trazadone- for sleep   Virtual consult.  Needs refill on Trazodone. Takes it nightly.  When not taking this probably wakes up 6 times in the night.   Uses 1/2 tablet nightly.  Denies a lot of stress.  Has hx/o OCD, has a lot of obsessive thoughts.    Not currently seeing counseling.   No prior CBT.  No prior sleep study.   Takes Lexapro regularly, prescribed by gynecology.   Went to gynecology last week, having hot flashes, night sweats.  Had some labs.   Vitamin D and thyroid was normal.  Blood counts were fine as well.    Sees Dr. Velvet Bathe for gynecology.    Past Medical History:  Diagnosis Date   Anxiety    panic attacks   Depression    Migraine    Mononucleosis 12/2008   Psoriatic arthritis (HCC)    Current Outpatient Medications on File Prior to Visit  Medication Sig  Dispense Refill   escitalopram (LEXAPRO) 20 MG tablet Take 20 mg by mouth daily.     LARIN FE 1/20 1-20 MG-MCG tablet Take 1 tablet by mouth daily.     MOBIC 15 MG tablet Take 15 mg by mouth daily.     TALTZ 80 MG/ML SOAJ Inject into the skin.     traZODone (DESYREL) 50 MG tablet Take 0.5 tablets (25 mg total) by mouth at bedtime. 90 tablet 0   No current facility-administered medications on file prior to visit.   Review of Systems As in subjective    Objective:   Physical Exam Due to coronavirus pandemic stay at home measures, patient visit was virtual and they were not examined in person.   Gen: wd, wn, nad Psych: pleasant, answers questions appropriately       Assessment:     Encounter Diagnoses  Name Primary?   Chronic insomnia Yes   Obsessive-compulsive disorder, unspecified type    History of panic attacks        Plan:     Chronic insomnia-continue with good sleep hygiene measures.  Can continue trazodone, typically takes 25 mg one half tab daily.  We discussed possibly doing a neurology consult for sleep evaluation.  She will let me know if agreeable.  OCD , history of panic disorder-doing fine on Lexapro, prescribed by gynecology.  Continue counseling, consider CBT therapy as discussed  She had a recent panel of labs by her gynecologist.  Recommended she try and get [place] a copy of the recent labs  Blayne was seen today for consult.  Diagnoses and all orders for this visit:  Chronic insomnia  Obsessive-compulsive disorder, unspecified type  History of panic attacks    F/u for physical

## 2023-03-27 NOTE — Telephone Encounter (Signed)
Pt. Called stating she saw you this morning and you were going to call her trazodone in for her. I saw this refill request just came in.

## 2023-06-08 ENCOUNTER — Telehealth: Payer: Self-pay | Admitting: Medical

## 2023-06-08 ENCOUNTER — Other Ambulatory Visit: Payer: Self-pay | Admitting: Medical

## 2023-06-08 MED ORDER — TRAZODONE HCL 50 MG PO TABS: 25.0000 mg | ORAL_TABLET | Freq: Every day | ORAL | 0 refills | Status: DC

## 2023-06-08 NOTE — Telephone Encounter (Signed)
Fax refill request [facility]  Trazadone 50 mg # 90  1/2 tablet tablet at bedtime  Last filled 06/07/23   Dispensed # [id number]

## 2023-07-10 ENCOUNTER — Encounter: Payer: Self-pay | Admitting: Family Medicine

## 2023-07-10 ENCOUNTER — Telehealth (INDEPENDENT_AMBULATORY_CARE_PROVIDER_SITE_OTHER): Payer: BC Managed Care – PPO | Admitting: Family Medicine

## 2023-07-10 VITALS — Ht 63.0 in | Wt 200.0 lb

## 2023-07-10 DIAGNOSIS — R0609 Other forms of dyspnea: Secondary | ICD-10-CM

## 2023-07-10 DIAGNOSIS — J45909 Unspecified asthma, uncomplicated: Secondary | ICD-10-CM

## 2023-07-10 DIAGNOSIS — R059 Cough, unspecified: Secondary | ICD-10-CM

## 2023-07-10 MED ORDER — AIRSUPRA 90-80 MCG/ACT IN AERO
2.0000 | INHALATION_SPRAY | RESPIRATORY_TRACT | 0 refills | Status: AC | PRN
Start: 2023-07-10 — End: ?

## 2023-07-10 MED ORDER — METHYLPREDNISOLONE 4 MG PO TBPK: ORAL_TABLET | ORAL | 0 refills | Status: DC

## 2023-07-10 NOTE — Patient Instructions (Signed)
Stay well hydrated. Use the AirSupra inhaler as needed for any shortness of breath. You can use it eery 4 hours as needed. You may want to use it PRIOR to any activity that tends to trigger the wheezing and shortness of breath (ie before exercise). I recommend taking Mucinex DM 12 hour tablet twice daily. This will help loosen up any mucus or phlegm, and help treat/suppress the cough. You can use a decongestant such as sudafed if needed for sinus pain. (I wouldn't use the Dayquil, as there is overlap in ingredients with the Mucinex DM).  Start the Medrol dosepak if you aren't having improvement with the inhaler and mucinex (for any ongoing cough and shortness of breath). Contact us if you have any fever, or if you develop significant discolored mucus or phlegm, worsening sinus pain, or other new concerns.

## 2023-07-10 NOTE — Progress Notes (Signed)
Start time: 12:00 End time: 12:20  Virtual Visit via Video Note  I connected with Ashley Calderon on 07/10/23 by a video enabled telemedicine application and verified that I am speaking with the correct person using two identifiers.  Location: Patient: outside at a park Provider: office   I discussed the limitations of evaluation and management by telemedicine and the availability of in person appointments. The patient expressed understanding and agreed to proceed.  History of Present Illness:  Chief Complaint  Patient presents with   Cough    VIRTUAL cough x two weeks. Having SOB when she goes up steps. Having some wheezing when she breathes in. No fever.   When at the beach 2 [place] ago, her son got a cold, which she then caught from him.  She got better, but a few days after that she developed cough. Cough is dry, nonproductive.  Worse with a lot of talking.  DOE just started 2 days ago. Feels some wheezing Has some mild sinus congestion.  PMH, PSH, SH reviewed  Outpatient Encounter Medications as of 07/10/2023  Medication Sig Note   escitalopram (LEXAPRO) 20 MG tablet Take 20 mg by mouth daily.    LARIN FE 1/20 1-20 MG-MCG tablet Take 1 tablet by mouth daily.    Pseudoephedrine-APAP-DM (DAYQUIL PO) Take 2 each by mouth as needed. 07/10/2023: Last dose yesterday   TALTZ 80 MG/ML SOAJ Inject into the skin.    traZODone (DESYREL) 50 MG tablet Take 0.5 tablets (25 mg total) by mouth at bedtime. 07/10/2023: 1/2 tablet each night   [DISCONTINUED] MOBIC 15 MG tablet Take 15 mg by mouth daily.    No facility-administered encounter medications on file as of 07/10/2023.   Allergies  Allergen Reactions   Doxycycline Other (See Comments)    Possible vasculitis   Oxycodone Nausea And Vomiting   Penicillins Hives    Has patient had a PCN reaction causing immediate rash, facial/tongue/throat swelling, SOB or lightheadedness with hypotension: Yes Has patient had a PCN reaction causing  severe rash involving mucus membranes or skin necrosis: Yes Has patient had a PCN reaction that required hospitalization: No Has patient had a PCN reaction occurring within the last 10 years: Yes If all of the above answers are "NO", then may proceed with Cephalosporin use.     ROS: no f/c, n/v/d. URI symptoms per HPI.  No other complaints   Observations/Objective:  Ht 5\' 3"  (1.6 m)   Wt 200 lb (90.7 kg)   LMP 07/01/2023 (Exact Date)   BMI 35.43 kg/m   Pleasant, well-appearing female, walking around outside. Frequent dry, hacky cough. She is speaking comfortably, in no distress. Cranial nerves grossly intact. She is alert and oriented. Normal mood, grooming Exam is limited due to the virtual nature of the visit.   Assessment and Plan:  Mild reactive airways disease, unspecified whether persistent - post-viral. Instructed on proper use of inhaler. To start steroid pack if not improving - Plan: Albuterol-Budesonide (AIRSUPRA) 90-80 MCG/ACT AERO, methylPREDNISolone (MEDROL DOSEPAK) 4 MG TBPK tablet  Cough, unspecified type - rec mucinex DM, inhaler. To start steroids if persistent; to contact us for ABX if purulent mucus or phlegm.  DOE (dyspnea on exertion)  Risks/SE of meds reviewed.    Stay well hydrated. Use the AirSupra inhaler as needed for any shortness of breath. You can use it eery 4 hours as needed. You may want to use it PRIOR to any activity that tends to trigger the wheezing and shortness of  breath (ie before exercise). I recommend taking Mucinex DM 12 hour tablet twice daily. This will help loosen up any mucus or phlegm, and help treat/suppress the cough. You can use a decongestant such as sudafed if needed for sinus pain. (I wouldn't use the Dayquil, as there is overlap in ingredients with the Mucinex DM).  Start the Medrol dosepak if you aren't having improvement with the inhaler and mucinex (for any ongoing cough and shortness of breath). Contact us if you  have any fever, or if you develop significant discolored mucus or phlegm, worsening sinus pain, or other new concerns.   Follow Up Instructions:    I discussed the assessment and treatment plan with the patient. The patient was provided an opportunity to ask questions and all were answered. The patient agreed with the plan and demonstrated an understanding of the instructions.   The patient was advised to call back or seek an in-person evaluation if the symptoms worsen or if the condition fails to improve as anticipated.  I spent 20 minutes dedicated to the care of this patient, including pre-visit review of records, face to face time, post-visit ordering of testing and documentation.    [name], MD

## 2023-07-19 LAB — HM PAP SMEAR: HPV, high-risk: NEGATIVE

## 2023-07-27 ENCOUNTER — Encounter: Payer: Self-pay | Admitting: Family Medicine

## 2023-07-28 ENCOUNTER — Ambulatory Visit: Payer: BC Managed Care – PPO | Admitting: Medical

## 2023-07-28 VITALS — BP 112/70 | HR 97 | Resp 16 | Wt 214.2 lb

## 2023-07-28 DIAGNOSIS — R059 Cough, unspecified: Secondary | ICD-10-CM | POA: Diagnosis not present

## 2023-07-28 MED ORDER — BUDESONIDE-FORMOTEROL FUMARATE 160-4.5 MCG/ACT IN AERO: 2.0000 | INHALATION_SPRAY | Freq: Two times a day (BID) | RESPIRATORY_TRACT | 0 refills | Status: AC

## 2023-07-28 MED ORDER — BENZONATATE 200 MG PO CAPS: 200.0000 mg | ORAL_CAPSULE | Freq: Three times a day (TID) | ORAL | 0 refills | Status: DC | PRN

## 2023-07-28 NOTE — Progress Notes (Signed)
Subjective:  Ashley Calderon is a 32 y.o. female who presents for Chief Complaint  Patient presents with   Cough    Still has a cough.      Here for cough.  Started with cough a month ago.   Had virtual with Dr. Lynelle Doctor.  Was started on Airsuppra and prednisone oral.  Finished prednisone a week ago.   Still hears cough and sound in chest after coughing.  Didn't cough a lot 2 days ago but yesterday and Tuesday coughing all day long.     Had congestion prior but that resolved.  Had some SOB but that improved.  No fever, no sore throat, no ear pain, no NVD, no sinus or teeth pain.   Unproductive cough.  No hemoptysis.  No chest pain or leg swelling.  No hx/o asthma.    Son had gotten sick with congestion and cough prior to when Ashley Calderon starting having cough but currently Ashley Calderon doesn't feel sick, just nagging cough all the time.    When more active the couhg is worse.  Cold weather seems to make things worse.   No hx/o asthma.  Non-smoker.  No concern for exposure to mold.  No other aggravating or relieving factors.    No other c/o.  Past Medical History:  Diagnosis Date   Anxiety    panic attacks   Depression    Migraine    Mononucleosis 12/2008   Psoriatic arthritis (HCC)    Current Outpatient Medications on File Prior to Visit  Medication Sig Dispense Refill   Albuterol-Budesonide (AIRSUPRA) 90-80 MCG/ACT AERO Inhale 2 puffs into the lungs every 4 (four) hours as needed (wheezing or shortness of breath). 10.7 g 0   escitalopram (LEXAPRO) 20 MG tablet Take 20 mg by mouth daily.     LARIN FE 1/20 1-20 MG-MCG tablet Take 1 tablet by mouth daily.     TALTZ 80 MG/ML SOAJ Inject into the skin.     traZODone (DESYREL) 50 MG tablet Take 0.5 tablets (25 mg total) by mouth at bedtime. 90 tablet 0   No current facility-administered medications on file prior to visit.     The following portions of the patient's history were reviewed and updated as appropriate: allergies, current  medications, past family history, past medical history, past social history, past surgical history and problem list.  ROS Otherwise as in subjective above    Objective: BP 112/70   Pulse 97   Resp 16   Wt 214 lb 3.2 oz (97.2 kg)   LMP 07/01/2023 (Exact Date)   SpO2 97%   BMI 37.94 kg/m   General appearance: alert, no distress, well developed, well nourished, coughing some. HEENT: normocephalic, sclerae anicteric, conjunctiva pink and moist, TMs pearly, nares patent, no discharge or erythema, pharynx normal Oral cavity: MMM, no lesions Neck: supple, no lymphadenopathy, no thyromegaly, no masses Heart: RRR, normal S1, S2, no murmurs Lungs: CTA bilaterally, no wheezes, rhonchi, or rales Pulses: 2+ radial pulses, 2+ pedal pulses, normal cap refill Ext: no edema   Assessment: Encounter Diagnosis  Name Primary?   Cough, unspecified type Yes     Plan: We discussed possible causes of cough.  Symptoms suggest more of a reactive airway asthma picture with no history of asthma.  She does not have other illness symptoms currently.  Exam relatively normal today except coughing.  Offered baseline chest x-ray.  She declines for now.  PFT reviewed.  Advise good water intake, relative rest.  Avoid triggers such  as extreme cold  PFT normal  Begin trial of medicaiton below, can still use Airsuppra for rescue inhaler use.     If not much improved within 1 week, then will need xray, recheck.    [name] was seen today for cough.  Diagnoses and all orders for this visit:  Cough, unspecified type -     Spirometry with Graph  Other orders -     benzonatate (TESSALON) 200 MG capsule; Take 1 capsule (200 mg total) by mouth 3 (three) times daily as needed for cough. -     budesonide-formoterol (SYMBICORT) 160-4.5 MCG/ACT inhaler; Inhale 2 puffs into the lungs 2 (two) times daily.    Follow up: pending call back in 1 week

## 2023-08-07 ENCOUNTER — Other Ambulatory Visit: Payer: Self-pay | Admitting: Medical

## 2023-08-07 MED ORDER — CLARITHROMYCIN 500 MG PO TABS: 500.0000 mg | ORAL_TABLET | Freq: Two times a day (BID) | ORAL | 0 refills | Status: DC

## 2024-01-24 ENCOUNTER — Other Ambulatory Visit: Payer: Self-pay | Admitting: Medical

## 2024-01-25 NOTE — Telephone Encounter (Signed)
 Patient needs refill.

## 2024-01-25 NOTE — Telephone Encounter (Signed)
 Left message asking patient to please call back and schedule CPE so her refill can be sent.

## 2024-01-31 ENCOUNTER — Ambulatory Visit (INDEPENDENT_AMBULATORY_CARE_PROVIDER_SITE_OTHER): Payer: Self-pay | Admitting: Medical

## 2024-01-31 VITALS — BP 122/76 | HR 91 | Wt 221.4 lb

## 2024-01-31 DIAGNOSIS — F5104 Psychophysiologic insomnia: Secondary | ICD-10-CM | POA: Diagnosis not present

## 2024-01-31 DIAGNOSIS — F429 Obsessive-compulsive disorder, unspecified: Secondary | ICD-10-CM | POA: Diagnosis not present

## 2024-01-31 DIAGNOSIS — J45991 Cough variant asthma: Secondary | ICD-10-CM | POA: Diagnosis not present

## 2024-01-31 DIAGNOSIS — Z111 Encounter for screening for respiratory tuberculosis: Secondary | ICD-10-CM

## 2024-01-31 DIAGNOSIS — F410 Panic disorder [episodic paroxysmal anxiety] without agoraphobia: Secondary | ICD-10-CM

## 2024-01-31 DIAGNOSIS — Z79899 Other long term (current) drug therapy: Secondary | ICD-10-CM | POA: Insufficient documentation

## 2024-01-31 NOTE — Progress Notes (Signed)
 Subjective:  Ashley Calderon is a 33 y.o. female who presents for Chief Complaint  Patient presents with   Medical Management of Chronic Issues    Med check.       Medical team Sees dermatology, [facility] Karlon Schlafer, Christiane Cowing, PA-C here for primary care  Feels like mood is ok.   Takes lexapro 20mg  daily, does fine on this.   No depressed mood, no irritability, no quick to react to people.  Work is stressful.   Works at [employer].    Chronic cough, likely cough variant asthma -using Symbicort  occasionally, not regularly.  Last fall was initiated on Symbicort  and albuterol which resolved the cough.   This spring using allergy pill for allergy but no current breathing problems.   Uses trazodone  for sleep.  Recently went a night without it and didn't sleep well.    No other aggravating or relieving factors.    No other c/o.  Past Medical History:  Diagnosis Date   Anxiety    panic attacks   Depression    Migraine    Mononucleosis 12/2008   Psoriatic arthritis (HCC)    Current Outpatient Medications on File Prior to Visit  Medication Sig Dispense Refill   Albuterol-Budesonide  (AIRSUPRA ) 90-80 MCG/ACT AERO Inhale 2 puffs into the lungs every 4 (four) hours as needed (wheezing or shortness of breath). 10.7 g 0   budesonide -formoterol  (SYMBICORT ) 160-4.5 MCG/ACT inhaler Inhale 2 puffs into the lungs 2 (two) times daily. 6 g 0   escitalopram (LEXAPRO) 20 MG tablet Take 20 mg by mouth daily.     LARIN FE 1/20 1-20 MG-MCG tablet Take 1 tablet by mouth daily.     TALTZ 80 MG/ML SOAJ Inject into the skin.     traZODone  (DESYREL ) 50 MG tablet TAKE 1/2 (ONE-HALF) TABLET BY MOUTH AT BEDTIME 90 tablet 0   No current facility-administered medications on file prior to visit.     The following portions of the patient's history were reviewed and updated as appropriate: allergies, current medications, past family history, past medical history, past social history, past  surgical history and problem list.  ROS Otherwise as in subjective above     Objective: BP 122/76   Pulse 91   Wt 221 lb 6.4 oz (100.4 kg)   SpO2 99%   BMI 39.22 kg/m   Wt Readings from Last 3 Encounters:  01/31/24 221 lb 6.4 oz (100.4 kg)  07/28/23 214 lb 3.2 oz (97.2 kg)  07/10/23 200 lb (90.7 kg)   General appearance: alert, no distress, well developed, well nourished Neck: supple, no lymphadenopathy, no thyromegaly, no masses Heart: RRR, normal S1, S2, no murmurs Lungs: CTA bilaterally, no wheezes, rhonchi, or rales Pulses: 2+ radial pulses, 2+ pedal pulses, normal cap refill Ext: no edema     Assessment: Encounter Diagnoses  Name Primary?   Cough variant asthma Yes   Screening examination for pulmonary tuberculosis    Chronic insomnia    Obsessive-compulsive disorder, unspecified type    Panic disorder    High risk medication use      Plan: Cough variant asthma - possible worse induced by fall allergies.   Not currently having issues.   Consider restart of Symbicort  and albuterol in the fall.  Continue allegra spring allergy season and fall allergy season  Chronic insomnia - continue trazodone , works well for her  Panic disorder, OCD - she continues on Lexapro, prescribed by gynecology  High risk mediation - quanitiferon level today,  on Taltz per dermatology   [name] was seen today for medical management of chronic issues.  Diagnoses and all orders for this visit:  Cough variant asthma  Screening examination for pulmonary tuberculosis -     QuantiFERON-TB Gold Plus  Chronic insomnia  Obsessive-compulsive disorder, unspecified type  Panic disorder  High risk medication use    Follow up: pending labs

## 2024-02-01 ENCOUNTER — Ambulatory Visit: Payer: Self-pay | Admitting: Medical

## 2024-02-01 ENCOUNTER — Other Ambulatory Visit: Payer: Self-pay | Admitting: Medical

## 2024-02-01 MED ORDER — CLARITHROMYCIN 500 MG PO TABS: 500.0000 mg | ORAL_TABLET | Freq: Two times a day (BID) | ORAL | 0 refills | Status: DC

## 2024-02-01 NOTE — Progress Notes (Signed)
 abstract

## 2024-02-03 LAB — QUANTIFERON-TB GOLD PLUS
QuantiFERON Mitogen Value: >10 [IU]/mL
QuantiFERON Nil Value: 0.04 [IU]/mL
QuantiFERON TB1 Ag Value: 0.06 [IU]/mL
QuantiFERON TB2 Ag Value: 0.05 [IU]/mL
QuantiFERON-TB Gold Plus: NEGATIVE

## 2024-02-05 ENCOUNTER — Ambulatory Visit: Payer: Self-pay | Admitting: Medical

## 2024-02-05 NOTE — Progress Notes (Signed)
 Results sent through MyChart

## 2024-02-08 ENCOUNTER — Telehealth: Payer: Self-pay | Admitting: Medical

## 2024-02-08 NOTE — Telephone Encounter (Signed)
 Copied from CRM [id number]. Topic: General - Other >> Feb 08, 2024 12:34 PM [employer] wrote: Reason for CRM: Patient would like TB test results faxed to [facility].   Phone: [phone] Fax: [phone]   Requested information faxed to [facility]

## 2024-06-03 ENCOUNTER — Ambulatory Visit: Admitting: Medical

## 2024-06-03 VITALS — BP 110/70 | HR 97 | Wt 225.0 lb

## 2024-06-03 DIAGNOSIS — R1084 Generalized abdominal pain: Secondary | ICD-10-CM | POA: Diagnosis not present

## 2024-06-03 DIAGNOSIS — L409 Psoriasis, unspecified: Secondary | ICD-10-CM | POA: Diagnosis not present

## 2024-06-03 DIAGNOSIS — Z8379 Family history of other diseases of the digestive system: Secondary | ICD-10-CM

## 2024-06-03 NOTE — Progress Notes (Signed)
 Subjective: Chief Complaint  Patient presents with   Acute Visit    Would like to be checked for celiac disease as her daughter is positive for this. Has ongoing stomach issues    History of Present Illness Ashley Calderon is a 33 year old female who presents for evaluation of potential celiac disease due to her daughter's recent diagnosis.  Her daughter was recently diagnosed with celiac disease via biopsy, prompting a recommendation for family testing. [name] says she has a history of stomach issues and was tested for celiac disease in high school, which was negative. At that time, her symptoms were attributed to anxiety.  She experiences occasional abdominal pain after eating. Her bowel movements are frequent, occurring multiple times a day, but she does not consistently experience diarrhea or constipation. No blood or mucus in her stool.  Her diet typically includes skipping breakfast, having a sandwich or chicken with chips and fruit for lunch, and chicken or beef with vegetables and starch for dinner. She avoids spicy, greasy, or fried foods, drinks plenty of water, and walks several days a week.  She has not tried specific remedies such as probiotics or a food elimination diet, stating that her symptoms have been a constant part of her life. She has not undergone any scopes or specific gastrointestinal workups.  Her past medical history includes a diagnosis of psoriasis a couple of years ago, affecting her ankles and head. Her family history includes celiac disease in her daughter and psoriasis.  Recent blood work within the last year, including liver, kidney, and thyroid  function tests, were normal. A thyroid  test from July of last year was normal, and her vitamin D level was at the low end of normal. Her complete blood count was also normal.  No other aggravating or relieving factors. No other complaint.  Past Medical History:  Diagnosis Date   Anxiety    panic attacks    Depression    Migraine    Mononucleosis 12/2008   Psoriatic arthritis (HCC)    Current Outpatient Medications on File Prior to Visit  Medication Sig Dispense Refill   Albuterol-Budesonide  (AIRSUPRA ) 90-80 MCG/ACT AERO Inhale 2 puffs into the lungs every 4 (four) hours as needed (wheezing or shortness of breath). 10.7 g 0   BIMZELX 320 MG/2ML pen      budesonide -formoterol  (SYMBICORT ) 160-4.5 MCG/ACT inhaler Inhale 2 puffs into the lungs 2 (two) times daily. 6 g 0   escitalopram (LEXAPRO) 20 MG tablet Take 20 mg by mouth daily.     LARIN FE 1/20 1-20 MG-MCG tablet Take 1 tablet by mouth daily.     traZODone  (DESYREL ) 50 MG tablet TAKE 1/2 (ONE-HALF) TABLET BY MOUTH AT BEDTIME 90 tablet 0   No current facility-administered medications on file prior to visit.   ROS as in subjective   Objective: BP 110/70   Pulse 97   Wt 225 lb (102.1 kg)   BMI 39.86 kg/m   General appearence: alert, no distress, WD/WN,  Abdomen: +bs, soft, non tender, non distended, no masses, no hepatomegaly, no splenomegaly Pulses: 2+ symmetric, upper and lower extremities, normal cap refill   Assessment and Plan Encounter Diagnoses  Name Primary?   Generalized abdominal pain Yes   Family history of celiac disease    Psoriasis     Abdominal pain Intermittent abdominal pain, possibly related to celiac disease due to family history and symptoms. Previous tests for celiac negative, but reevaluation warranted. - Order celiac disease screening blood test. -  Order comprehensive metabolic panel including CRP. - Recommend dietary modifications: avoid spicy, fried, and acidic foods; focus on plant-based, non-gas causing foods. - Recommend trial of Restora probiotic once daily. - Consider referral to gastroenterologist if symptoms persist.  [name] was seen today for acute visit.  Diagnoses and all orders for this visit:  Generalized abdominal pain -     Celiac Ab tTG DGP TIgA -     CBC -     Comprehensive  metabolic panel with GFR -     Gamma GT -     High sensitivity CRP -     TSH -     Lipase  Family history of celiac disease -     Celiac Ab tTG DGP TIgA -     CBC -     Comprehensive metabolic panel with GFR -     Gamma GT -     High sensitivity CRP -     TSH -     Lipase  Psoriasis -     High sensitivity CRP   F/u pending labs

## 2024-06-04 ENCOUNTER — Ambulatory Visit: Payer: Self-pay | Admitting: Medical

## 2024-06-04 NOTE — Progress Notes (Signed)
 Results through MyChart

## 2024-06-06 LAB — COMPREHENSIVE METABOLIC PANEL WITH GFR
ALT: 34 IU/L — AB (ref 0–32)
AST: 32 IU/L (ref 0–40)
Albumin: 4 g/dL (ref 3.9–4.9)
Alkaline Phosphatase: 98 IU/L (ref 41–116)
BUN/Creatinine Ratio: 13 (ref 9–23)
BUN: 12 mg/dL (ref 6–20)
Bilirubin Total: 0.5 mg/dL (ref 0.0–1.2)
CO2: 19 mmol/L — AB (ref 20–29)
Calcium: 9.3 mg/dL (ref 8.7–10.2)
Chloride: 103 mmol/L (ref 96–106)
Creatinine, Ser: 0.95 mg/dL (ref 0.57–1.00)
Globulin, Total: 2.8 g/dL (ref 1.5–4.5)
Glucose: 120 mg/dL — AB (ref 70–99)
Potassium: 3.8 mmol/L (ref 3.5–5.2)
Sodium: 136 mmol/L (ref 134–144)
Total Protein: 6.8 g/dL (ref 6.0–8.5)
eGFR: 82 mL/min/1.73 (ref >59)

## 2024-06-06 LAB — CELIAC AB TTG DGP TIGA
Antigliadin Abs, IgA: 3 U (ref 0–19)
Gliadin IgG: 1 U (ref 0–19)
IgA/Immunoglobulin A, Serum: 167 mg/dL (ref 87–352)
Tissue Transglut Ab: <2 U/mL (ref 0–5)
Transglutaminase IgA: <2 U/mL (ref 0–3)

## 2024-06-06 LAB — TSH: TSH: 2.27 u[IU]/mL (ref 0.450–4.500)

## 2024-06-06 LAB — CBC
Hematocrit: 38.8 % (ref 34.0–46.6)
Hemoglobin: 12.8 g/dL (ref 11.1–15.9)
MCH: 29.9 pg (ref 26.6–33.0)
MCHC: 33 g/dL (ref 31.5–35.7)
MCV: 91 fL (ref 79–97)
Platelets: 295 x10E3/uL (ref 150–450)
RBC: 4.28 x10E6/uL (ref 3.77–5.28)
RDW: 11.8 % (ref 11.7–15.4)
WBC: 7.8 x10E3/uL (ref 3.4–10.8)

## 2024-06-06 LAB — LIPASE: Lipase: 37 U/L (ref 14–72)

## 2024-06-06 LAB — GAMMA GT: GGT: 42 IU/L (ref 0–60)

## 2024-06-06 LAB — HIGH SENSITIVITY CRP: CRP, High Sensitivity: 13.86 mg/L — AB (ref 0.00–3.00)

## 2024-06-08 ENCOUNTER — Other Ambulatory Visit: Payer: Self-pay | Admitting: Medical

## 2024-06-08 NOTE — Progress Notes (Signed)
 Results to MyChart

## 2024-06-26 ENCOUNTER — Other Ambulatory Visit: Payer: Self-pay | Admitting: Medical

## 2024-09-02 ENCOUNTER — Other Ambulatory Visit: Payer: Self-pay | Admitting: Medical

## 2024-09-02 MED ORDER — TRAZODONE HCL 50 MG PO TABS: 25.0000 mg | ORAL_TABLET | Freq: Every day | ORAL | 0 refills | Status: AC
